# Patient Record
Sex: Female | Born: 1980 | Hispanic: Yes | Marital: Married | Smoking: Never smoker
Health system: Southern US, Community
[De-identification: ages and names within clinical notes are randomized; demographics above are authoritative.]

## PROBLEM LIST (undated history)

## (undated) DIAGNOSIS — R519 Headache, unspecified: Secondary | ICD-10-CM

## (undated) DIAGNOSIS — N83209 Unspecified ovarian cyst, unspecified side: Secondary | ICD-10-CM

## (undated) DIAGNOSIS — D352 Benign neoplasm of pituitary gland: Secondary | ICD-10-CM

## (undated) DIAGNOSIS — E669 Obesity, unspecified: Secondary | ICD-10-CM

## (undated) HISTORY — PX: LAPAROSCOPIC GASTRIC SLEEVE RESECTION: SHX5895

## (undated) HISTORY — DX: Unspecified ovarian cyst, unspecified side: N83.209

## (undated) HISTORY — DX: Obesity, unspecified: E66.9

## (undated) HISTORY — DX: Headache, unspecified: R51.9

## (undated) HISTORY — DX: Benign neoplasm of pituitary gland: D35.2

## (undated) HISTORY — PX: OVARIAN CYST REMOVAL: SHX89

---

## 2020-02-13 LAB — OB RESULTS CONSOLE HEPATITIS B SURFACE ANTIGEN: Hepatitis B Surface Ag: NEGATIVE

## 2020-02-13 LAB — OB RESULTS CONSOLE RUBELLA ANTIBODY, IGM: Rubella: IMMUNE

## 2020-02-13 LAB — OB RESULTS CONSOLE RPR: RPR: NONREACTIVE

## 2020-02-13 LAB — OB RESULTS CONSOLE ABO/RH: RH Type: POSITIVE

## 2020-02-13 LAB — OB RESULTS CONSOLE ANTIBODY SCREEN: Antibody Screen: NEGATIVE

## 2020-02-13 LAB — OB RESULTS CONSOLE HIV ANTIBODY (ROUTINE TESTING): HIV: NONREACTIVE

## 2020-02-15 LAB — OB RESULTS CONSOLE GC/CHLAMYDIA
Chlamydia: NEGATIVE
Gonorrhea: NEGATIVE

## 2020-04-09 NOTE — L&D Delivery Note (Signed)
Operative Delivery Note Notified by Beatrix Fetters, CNM that with the start of pushing there were deep variables, occas late and fetal tachycardia.  Anderson Malta assessed pt and stated the station is good for a vacuum.  Pt is afebile (temp 99) and when not pushing the tracing is overall reassuring but cat 2.  I presented to assess the pt and with pushing the head is at +2.  Verbal consent obtained.  At 5:25 AM a viable female was delivered via Vaginal, Vacuum Neurosurgeon).  Presentation: vertex; Position: Right,, Occiput,, Anterior; Station: +2.  Delivery occurred with 1st full push with vacuum placed at 50cm Hg.  Baby had a vigorous cry on mom's chest and delayed cord clamping was done.  Verbal consent: obtained from patient.  Risks and benefits discussed in detail.  Risks include, but are not limited to the risks of anesthesia, bleeding, infection, damage to maternal tissues, fetal cephalhematoma.  There is also the risk of inability to effect vaginal delivery of the head, or shoulder dystocia that cannot be resolved by established maneuvers, leading to the need for emergency cesarean section.  Rectal performed after repair and good sphincter tone.  APGAR: 8, 9; weight  .   Placenta status: spont,intact .   Cord:  3V cord with the following complications: None.  Cord pH: n/a  Anesthesia:  epidural Instruments: mityvac bell  Episiotomy: None Lacerations:  2nd degree vaginal  Suture Repair: 2.0 3.0 vicryl Est. Blood Loss (mL):  300cc  Mom to postpartum.  Baby to Couplet care / Skin to Skin.  Delice Lesch 08/27/2020, 5:55 AM

## 2020-04-22 ENCOUNTER — Other Ambulatory Visit: Payer: Self-pay | Admitting: Obstetrics and Gynecology

## 2020-04-22 DIAGNOSIS — Z3A22 22 weeks gestation of pregnancy: Secondary | ICD-10-CM

## 2020-04-22 DIAGNOSIS — O09522 Supervision of elderly multigravida, second trimester: Secondary | ICD-10-CM

## 2020-04-22 DIAGNOSIS — Z363 Encounter for antenatal screening for malformations: Secondary | ICD-10-CM

## 2020-05-05 ENCOUNTER — Encounter: Payer: Self-pay | Admitting: *Deleted

## 2020-05-09 ENCOUNTER — Other Ambulatory Visit: Payer: Self-pay | Admitting: *Deleted

## 2020-05-09 ENCOUNTER — Ambulatory Visit: Payer: BC Managed Care – PPO | Admitting: *Deleted

## 2020-05-09 ENCOUNTER — Ambulatory Visit: Payer: BC Managed Care – PPO | Attending: Obstetrics and Gynecology

## 2020-05-09 ENCOUNTER — Encounter: Payer: Self-pay | Admitting: *Deleted

## 2020-05-09 ENCOUNTER — Other Ambulatory Visit: Payer: Self-pay

## 2020-05-09 VITALS — BP 110/54 | HR 82 | Ht 65.0 in

## 2020-05-09 DIAGNOSIS — Z363 Encounter for antenatal screening for malformations: Secondary | ICD-10-CM | POA: Diagnosis present

## 2020-05-09 DIAGNOSIS — Z362 Encounter for other antenatal screening follow-up: Secondary | ICD-10-CM

## 2020-05-09 DIAGNOSIS — O09512 Supervision of elderly primigravida, second trimester: Secondary | ICD-10-CM | POA: Diagnosis present

## 2020-05-09 DIAGNOSIS — O09522 Supervision of elderly multigravida, second trimester: Secondary | ICD-10-CM | POA: Insufficient documentation

## 2020-05-09 DIAGNOSIS — Z3A22 22 weeks gestation of pregnancy: Secondary | ICD-10-CM | POA: Diagnosis present

## 2020-06-06 ENCOUNTER — Ambulatory Visit: Payer: BC Managed Care – PPO | Attending: Obstetrics and Gynecology

## 2020-06-06 ENCOUNTER — Other Ambulatory Visit: Payer: Self-pay | Admitting: *Deleted

## 2020-06-06 ENCOUNTER — Encounter: Payer: Self-pay | Admitting: *Deleted

## 2020-06-06 ENCOUNTER — Other Ambulatory Visit: Payer: Self-pay

## 2020-06-06 ENCOUNTER — Ambulatory Visit: Payer: BC Managed Care – PPO | Admitting: *Deleted

## 2020-06-06 DIAGNOSIS — Z9884 Bariatric surgery status: Secondary | ICD-10-CM

## 2020-06-06 DIAGNOSIS — Z362 Encounter for other antenatal screening follow-up: Secondary | ICD-10-CM | POA: Diagnosis not present

## 2020-07-11 ENCOUNTER — Other Ambulatory Visit: Payer: Self-pay | Admitting: *Deleted

## 2020-07-11 ENCOUNTER — Other Ambulatory Visit: Payer: Self-pay

## 2020-07-11 ENCOUNTER — Ambulatory Visit: Payer: BC Managed Care – PPO | Attending: Obstetrics and Gynecology

## 2020-07-11 ENCOUNTER — Ambulatory Visit: Payer: BC Managed Care – PPO | Admitting: *Deleted

## 2020-07-11 ENCOUNTER — Encounter: Payer: Self-pay | Admitting: *Deleted

## 2020-07-11 VITALS — BP 113/57 | HR 76

## 2020-07-11 DIAGNOSIS — E669 Obesity, unspecified: Secondary | ICD-10-CM

## 2020-07-11 DIAGNOSIS — R638 Other symptoms and signs concerning food and fluid intake: Secondary | ICD-10-CM

## 2020-07-11 DIAGNOSIS — O99843 Bariatric surgery status complicating pregnancy, third trimester: Secondary | ICD-10-CM

## 2020-07-11 DIAGNOSIS — O09513 Supervision of elderly primigravida, third trimester: Secondary | ICD-10-CM | POA: Diagnosis present

## 2020-07-11 DIAGNOSIS — O99213 Obesity complicating pregnancy, third trimester: Secondary | ICD-10-CM | POA: Diagnosis not present

## 2020-07-11 DIAGNOSIS — Z9884 Bariatric surgery status: Secondary | ICD-10-CM | POA: Diagnosis present

## 2020-07-11 DIAGNOSIS — Z363 Encounter for antenatal screening for malformations: Secondary | ICD-10-CM | POA: Diagnosis not present

## 2020-07-11 DIAGNOSIS — Z3A32 32 weeks gestation of pregnancy: Secondary | ICD-10-CM

## 2020-08-09 LAB — OB RESULTS CONSOLE GBS: GBS: NEGATIVE

## 2020-08-15 ENCOUNTER — Encounter: Payer: Self-pay | Admitting: *Deleted

## 2020-08-15 ENCOUNTER — Ambulatory Visit: Payer: Medicaid Other | Admitting: *Deleted

## 2020-08-15 ENCOUNTER — Ambulatory Visit: Payer: Medicaid Other | Attending: Obstetrics

## 2020-08-15 ENCOUNTER — Other Ambulatory Visit: Payer: Self-pay

## 2020-08-15 VITALS — BP 125/67 | HR 65

## 2020-08-15 DIAGNOSIS — Z363 Encounter for antenatal screening for malformations: Secondary | ICD-10-CM | POA: Diagnosis not present

## 2020-08-15 DIAGNOSIS — Z3A37 37 weeks gestation of pregnancy: Secondary | ICD-10-CM

## 2020-08-15 DIAGNOSIS — O09513 Supervision of elderly primigravida, third trimester: Secondary | ICD-10-CM | POA: Insufficient documentation

## 2020-08-15 DIAGNOSIS — R638 Other symptoms and signs concerning food and fluid intake: Secondary | ICD-10-CM

## 2020-08-15 DIAGNOSIS — O99213 Obesity complicating pregnancy, third trimester: Secondary | ICD-10-CM

## 2020-08-18 ENCOUNTER — Encounter (HOSPITAL_COMMUNITY): Payer: Self-pay | Admitting: *Deleted

## 2020-08-18 ENCOUNTER — Telehealth (HOSPITAL_COMMUNITY): Payer: Self-pay | Admitting: *Deleted

## 2020-08-18 NOTE — Telephone Encounter (Signed)
Preadmission screen  

## 2020-08-25 NOTE — H&P (Incomplete)
HPI: 40 y.o. G1P0 @ [redacted]w[redacted]d estimated gestational age (as dated by LMP c/w *** week ultrasound) presents for ***.  Leakage of fluid:  *** Vaginal bleeding:  *** Contractions:  *** Fetal movement:  ***  Prenatal care has been provided by ***  ROS:  Denies fevers, chills, chest pain, visual changes, SOB, RUQ/epigastric pain, N/V, dysuria, hematuria, or sudden onset/worsening bilateral LE or facial edema.  Pregnancy complicated by: ***   Prenatal Transfer Tool  Maternal Diabetes: {Maternal Diabetes:3043596} Genetic Screening: {Genetic Screening:20205} Maternal Ultrasounds/Referrals: {Maternal Ultrasounds / Referrals:20211} Fetal Ultrasounds or other Referrals:  {Fetal Ultrasounds or Other Referrals:20213} Maternal Substance Abuse:  {Maternal Substance Abuse:20223} Significant Maternal Medications:  {Significant Maternal Meds:20233} Significant Maternal Lab Results: {Significant Maternal Lab Results:20235}   Prenatal Labs Blood type:  *** Antibody screen:  Negative CBC:  H/H *** Rubella: Immune RPR:  Non-reactive Hep B:  Negative Hep C:  Negative HIV:  Negative GC/CT:  Negative Glucola:  ***  Immunizations: Tdap: *** Flu: ***  OBHx:  OB History    Gravida  1   Para      Term      Preterm      AB      Living        SAB      IAB      Ectopic      Multiple      Live Births             PMHx:  *** Meds:  PNV, *** Allergy:  No Known Allergies SurgHx: *** SocHx:   *** Tobacco, ETOH, illicit drugs  O: LMP 91/63/8466  Gen. AAOx3, NAD CV.  RRR  Resp. CTAB, no wheezes/rales/rhonchi Abd. Gravid, soft, non-tender throughout, no rebound/guarding Extr.  *** bilateral LE edema, no calf tenderness bilaterally SVE: ***/***/***  SSE: No vulvar/vaginal/cervical lesions.  No blood visualized in vaginal vault. Cervix appeared closed. ***Pooling, *** Ferning, *** Nitrazine.  Last [place]:     FHT: *** baseline, *** variability, *** accels,  *** decels Toco: q  *** min   Labs: see orders  A/P:  40 y.o. G1P0 @ [redacted]w[redacted]d who presents for ***.  - Admit to *** - Admit labs (CBC, T&S, COVID screen) - CEFM/Toco - FWB:  *** - Diet:  *** - IVF:  *** - VTE Prophylaxis:  SCDs - GBS Status:  *** - Presentation:  *** - Pain control:  *** - Induction method:  Drema Dallas, DO [phone] (office)

## 2020-08-26 ENCOUNTER — Encounter (HOSPITAL_COMMUNITY): Payer: Self-pay | Admitting: Obstetrics and Gynecology

## 2020-08-26 ENCOUNTER — Inpatient Hospital Stay (HOSPITAL_COMMUNITY)
Admission: AD | Admit: 2020-08-26 | Discharge: 2020-08-29 | DRG: 807 | Disposition: A | Discharge status: Home / Self Care | Payer: BC Managed Care – PPO | Attending: Obstetrics and Gynecology | Admitting: Obstetrics and Gynecology

## 2020-08-26 ENCOUNTER — Inpatient Hospital Stay (HOSPITAL_COMMUNITY): Payer: BC Managed Care – PPO | Admitting: Anesthesiology

## 2020-08-26 DIAGNOSIS — Z20822 Contact with and (suspected) exposure to covid-19: Secondary | ICD-10-CM | POA: Diagnosis present

## 2020-08-26 DIAGNOSIS — O134 Gestational [pregnancy-induced] hypertension without significant proteinuria, complicating childbirth: Principal | ICD-10-CM | POA: Diagnosis present

## 2020-08-26 DIAGNOSIS — O329XX0 Maternal care for malpresentation of fetus, unspecified, not applicable or unspecified: Secondary | ICD-10-CM | POA: Diagnosis not present

## 2020-08-26 DIAGNOSIS — O99844 Bariatric surgery status complicating childbirth: Secondary | ICD-10-CM | POA: Diagnosis present

## 2020-08-26 DIAGNOSIS — O26893 Other specified pregnancy related conditions, third trimester: Secondary | ICD-10-CM | POA: Diagnosis present

## 2020-08-26 DIAGNOSIS — Z3A38 38 weeks gestation of pregnancy: Secondary | ICD-10-CM

## 2020-08-26 DIAGNOSIS — Z3A00 Weeks of gestation of pregnancy not specified: Secondary | ICD-10-CM | POA: Diagnosis not present

## 2020-08-26 DIAGNOSIS — D352 Benign neoplasm of pituitary gland: Secondary | ICD-10-CM | POA: Diagnosis present

## 2020-08-26 DIAGNOSIS — Z8616 Personal history of COVID-19: Secondary | ICD-10-CM

## 2020-08-26 DIAGNOSIS — O99214 Obesity complicating childbirth: Secondary | ICD-10-CM | POA: Diagnosis present

## 2020-08-26 DIAGNOSIS — O99892 Other specified diseases and conditions complicating childbirth: Secondary | ICD-10-CM | POA: Diagnosis present

## 2020-08-26 LAB — CBC
HCT: 38.1 % (ref 36.0–46.0)
Hemoglobin: 12.9 g/dL (ref 12.0–15.0)
MCH: 29.9 pg (ref 26.0–34.0)
MCHC: 33.9 g/dL (ref 30.0–36.0)
MCV: 88.4 fL (ref 80.0–100.0)
Platelets: 271 10*3/uL (ref 150–400)
RBC: 4.31 MIL/uL (ref 3.87–5.11)
RDW: 14.6 % (ref 11.5–15.5)
WBC: 9.3 10*3/uL (ref 4.0–10.5)
nRBC: 0 % (ref 0.0–0.2)

## 2020-08-26 LAB — COMPREHENSIVE METABOLIC PANEL
ALT: 13 U/L (ref 0–44)
AST: 19 U/L (ref 15–41)
Albumin: 2.8 g/dL — ABNORMAL LOW (ref 3.5–5.0)
Alkaline Phosphatase: 118 U/L (ref 38–126)
Anion gap: 8 (ref 5–15)
BUN: 5 mg/dL — ABNORMAL LOW (ref 6–20)
CO2: 19 mmol/L — ABNORMAL LOW (ref 22–32)
Calcium: 9 mg/dL (ref 8.9–10.3)
Chloride: 106 mmol/L (ref 98–111)
Creatinine, Ser: 0.65 mg/dL (ref 0.44–1.00)
GFR, Estimated: >60 mL/min (ref >60)
Glucose, Bld: 106 mg/dL — ABNORMAL HIGH (ref 70–99)
Potassium: 3.9 mmol/L (ref 3.5–5.1)
Sodium: 133 mmol/L — ABNORMAL LOW (ref 135–145)
Total Bilirubin: 0.5 mg/dL (ref 0.3–1.2)
Total Protein: 6.3 g/dL — ABNORMAL LOW (ref 6.5–8.1)

## 2020-08-26 LAB — TYPE AND SCREEN
ABO/RH(D): O POS
Antibody Screen: NEGATIVE

## 2020-08-26 LAB — PROTEIN / CREATININE RATIO, URINE
Creatinine, Urine: 52.56 mg/dL
Total Protein, Urine: <6 mg/dL

## 2020-08-26 LAB — RAPID HIV SCREEN (HIV 1/2 AB+AG)
HIV 1/2 Antibodies: NONREACTIVE
HIV-1 P24 Antigen - HIV24: NONREACTIVE

## 2020-08-26 LAB — RESP PANEL BY RT-PCR (FLU A&B, COVID) ARPGX2
Influenza A by PCR: NEGATIVE
Influenza B by PCR: NEGATIVE
SARS Coronavirus 2 by RT PCR: NEGATIVE

## 2020-08-26 LAB — POCT FERN TEST: POCT Fern Test: POSITIVE

## 2020-08-26 LAB — RPR: RPR Ser Ql: NONREACTIVE

## 2020-08-26 MED ORDER — LACTATED RINGERS IV SOLN
500.0000 mL | Freq: Once | INTRAVENOUS | Status: AC
  Administered 2020-08-26: 500 mL via INTRAVENOUS

## 2020-08-26 MED ORDER — ACETAMINOPHEN 325 MG PO TABS: 650.0000 mg | ORAL_TABLET | ORAL | Status: DC | PRN

## 2020-08-26 MED ORDER — LIDOCAINE HCL (PF) 1 % IJ SOLN
INTRAMUSCULAR | Status: DC | PRN
  Administered 2020-08-26: 2 mL via EPIDURAL
  Administered 2020-08-26: 10 mL via EPIDURAL

## 2020-08-26 MED ORDER — EPHEDRINE 5 MG/ML INJ: 10.0000 mg | INTRAVENOUS | Status: DC | PRN

## 2020-08-26 MED ORDER — OXYTOCIN-SODIUM CHLORIDE 30-0.9 UT/500ML-% IV SOLN
2.5000 [IU]/h | INTRAVENOUS | Status: DC
  Filled 2020-08-26 (×2): qty 500

## 2020-08-26 MED ORDER — TERBUTALINE SULFATE 1 MG/ML IJ SOLN: 0.2500 mg | Freq: Once | INTRAMUSCULAR | Status: DC | PRN

## 2020-08-26 MED ORDER — FENTANYL-BUPIVACAINE-NACL 0.5-0.125-0.9 MG/250ML-% EP SOLN
EPIDURAL | Status: DC | PRN
  Administered 2020-08-26: 12 mL/h via EPIDURAL

## 2020-08-26 MED ORDER — LACTATED RINGERS IV SOLN
500.0000 mL | INTRAVENOUS | Status: DC | PRN
Start: 2020-08-26 — End: 2020-08-27
  Administered 2020-08-26: 500 mL via INTRAVENOUS

## 2020-08-26 MED ORDER — OXYTOCIN-SODIUM CHLORIDE 30-0.9 UT/500ML-% IV SOLN
1.0000 m[IU]/min | INTRAVENOUS | Status: DC
  Administered 2020-08-26: 4 m[IU]/min via INTRAVENOUS
  Administered 2020-08-26: 2 m[IU]/min via INTRAVENOUS
  Administered 2020-08-26: 6 m[IU]/min via INTRAVENOUS
  Administered 2020-08-26: 8 m[IU]/min via INTRAVENOUS
  Administered 2020-08-26: 10 m[IU]/min via INTRAVENOUS
  Administered 2020-08-26: 6 m[IU]/min via INTRAVENOUS

## 2020-08-26 MED ORDER — LIDOCAINE HCL (PF) 1 % IJ SOLN: 30.0000 mL | INTRAMUSCULAR | Status: DC | PRN

## 2020-08-26 MED ORDER — LACTATED RINGERS IV SOLN: INTRAVENOUS | Status: DC

## 2020-08-26 MED ORDER — OXYTOCIN BOLUS FROM INFUSION
333.0000 mL | Freq: Once | INTRAVENOUS | Status: AC
  Administered 2020-08-27: 333 mL via INTRAVENOUS

## 2020-08-26 MED ORDER — OXYCODONE-ACETAMINOPHEN 5-325 MG PO TABS: 1.0000 | ORAL_TABLET | ORAL | Status: DC | PRN

## 2020-08-26 MED ORDER — PHENYLEPHRINE 40 MCG/ML (10ML) SYRINGE FOR IV PUSH (FOR BLOOD PRESSURE SUPPORT): 80.0000 ug | PREFILLED_SYRINGE | INTRAVENOUS | Status: DC | PRN

## 2020-08-26 MED ORDER — ONDANSETRON HCL 4 MG/2ML IJ SOLN: 4.0000 mg | Freq: Four times a day (QID) | INTRAMUSCULAR | Status: DC | PRN

## 2020-08-26 MED ORDER — SOD CITRATE-CITRIC ACID 500-334 MG/5ML PO SOLN
30.0000 mL | ORAL | Status: DC | PRN
  Filled 2020-08-26: qty 15

## 2020-08-26 MED ORDER — FENTANYL CITRATE (PF) 100 MCG/2ML IJ SOLN
50.0000 ug | INTRAMUSCULAR | Status: DC | PRN
  Administered 2020-08-26: 100 ug via INTRAVENOUS
  Filled 2020-08-26: qty 2

## 2020-08-26 MED ORDER — OXYCODONE-ACETAMINOPHEN 5-325 MG PO TABS: 2.0000 | ORAL_TABLET | ORAL | Status: DC | PRN

## 2020-08-26 MED ORDER — DIPHENHYDRAMINE HCL 50 MG/ML IJ SOLN: 12.5000 mg | INTRAMUSCULAR | Status: DC | PRN

## 2020-08-26 MED ORDER — FENTANYL-BUPIVACAINE-NACL 0.5-0.125-0.9 MG/250ML-% EP SOLN
12.0000 mL/h | EPIDURAL | Status: DC | PRN
  Administered 2020-08-26: 12 mL/h via EPIDURAL
  Filled 2020-08-26 (×2): qty 250

## 2020-08-26 MED ORDER — OXYTOCIN-SODIUM CHLORIDE 30-0.9 UT/500ML-% IV SOLN: 1.0000 m[IU]/min | INTRAVENOUS | Status: DC

## 2020-08-26 NOTE — H&P (Signed)
OB ADMISSION/ HISTORY & PHYSICAL:  Admission Date: 08/26/2020  1:42 AM  Admit Diagnosis: Normal Labor  Elizabeth Galvan is a 40 y.o. female G75P0 [redacted]w[redacted]d presenting for ctx and LOF. Endorses active FM, denies vaginal bleeding. Ctx began @ 0100 History of current pregnancy: G1P0   Primary OB Provider: Sadie Haber Patient entered care with [facility] at 8.6 wks.   EDC 08/31/20 by 8.6 wk U/S  Anatomy scan:  20.1 wks, complete w/ anterior/ fundal placenta.   Antenatal testing: for BMI > 40 started at 32 weeks Last evaluation: 38.6  wks  Significant prenatal events:  Hx of gastric sleeve Migraines Obesity - BMI 3 AMA Pituitary Adenoma - f/u PP   Prenatal Labs: ABO, Rh: O/Positive/-- (11/06 0000) Antibody: Negative (11/06 0000) Rubella: Immune (11/06 0000)  RPR: Nonreactive (11/06 0000)  HBsAg: Negative (11/06 0000)  HIV: Non-reactive (11/06 0000)  GTT: HgbA1C 5.5 GBS:   Negative GC/CHL: negative/negative Genetics: Low risk female Tdap/influenza vaccines: UTD/UTD   OB History  Gravida Para Term Preterm AB Living  1            SAB IAB Ectopic Multiple Live Births               # Outcome Date GA Lbr Len/2nd Weight Sex Delivery Anes PTL Lv  1 Current             Medical / Surgical History: Past medical history:  Past Medical History:  Diagnosis Date  . Headache    migraines  . Obesity   . Ovarian cyst   . Pituitary adenoma ([place])     Past surgical history:  Past Surgical History:  Procedure Laterality Date  . LAPAROSCOPIC GASTRIC SLEEVE RESECTION    . OVARIAN CYST REMOVAL     Family History:  Family History  Problem Relation Age of Onset  . Diabetes Mother   . Hypertension Mother   . Varicose Veins Mother     Social History:  reports that she has never smoked. She has never used smokeless tobacco. She reports that she does not drink alcohol and does not use drugs.  Allergies: Patient has no known allergies.   Current Medications at time of admission:  Prior to  Admission medications   Medication Sig Start Date End Date Taking? Authorizing Provider  aspirin EC 81 MG tablet Take 81 mg by mouth daily. Swallow whole.   Yes [provider]  ferrous sulfate 324 MG TBEC Take 324 mg by mouth.   Yes [provider]  Prenatal Vit-Fe Fumarate-FA (PRENATAL MULTIVITAMIN) TABS tablet Take 1 tablet by mouth daily at 12 noon.   Yes [provider]  calcium-vitamin D (OSCAL WITH D) 500-200 MG-UNIT tablet Take 1 tablet by mouth.    [provider]    Review of Systems: Constitutional: Negative   HENT: Negative   Eyes: Negative   Respiratory: Negative   Cardiovascular: Negative   Gastrointestinal: Negative  Genitourinary: negative for bloody show, positive for LOF   Musculoskeletal: Negative   Skin: Negative   Neurological: Negative   Endo/Heme/Allergies: Negative   Psychiatric/Behavioral: Negative    Physical Exam: VS: Blood pressure 138/73, pulse 68, temperature 98.5 F (36.9 C), resp. rate 18, height 5\' 5"  (1.651 m), weight 126.1 kg, last menstrual period 11/30/2019. AAO x3, no signs of distress Cardiovascular: RRR Respiratory: Lung fields clear to ausculation GU/GI: Abdomen gravid, non-tender, non-distended, active FM, vertex Extremities: negative edema, negative for pain, tenderness, and cords  Cervical exam: 1/50/-3 FHR: baseline rate  145 / variability moderate / accelerations 10x10 / absent decelerations TOCO: irregular   Prenatal Transfer Tool  Maternal Diabetes: No Genetic Screening: Normal Maternal Ultrasounds/Referrals: Normal Fetal Ultrasounds or other Referrals:  None Maternal Substance Abuse:  No Significant Maternal Medications:  None Significant Maternal Lab Results: Group B Strep negative    Assessment: 40 y.o. G1P0 [redacted]w[redacted]d admitted for normal labor. Reports SROM on 08/26/20 @ 0100. Clear fluid.  Latent stage of labor FHR category 1 GBS Negative Pain management plan: Epidural  PRN   Plan:  Admit to L&D Routine admission orders Epidural PRN Report given to Dr Delora Fuel at [facility] MSN, CNM 08/26/2020 3:31 AM

## 2020-08-26 NOTE — Progress Notes (Signed)
OB Progress Note  S: Patient resting comfortably. Feeling some contractions.  O: BP (!) 116/59   Pulse (!) 58   Temp 98.3 F (36.8 C) (Oral)   Resp 17   Ht 5\' 5"  (1.651 m)   Wt 126.1 kg   LMP 11/30/2019   SpO2 100%   BMI 46.26 kg/m   FHT: 145bpm, minimal to moderate variablity, - accels, - decels, scalp stim present Toco: q3 minutes SVE: 2-3/50/high, meconium noted at time of cervical exam  A/P: 40 y.o. G1P0 @ [redacted]w[redacted]d admitted for SROM/labor.  FWB: Cat. I to Category II, reassurance by scalp stim 15x15 Labor course: Will start Pitocin 2x2 to maximum of 55mU/min Pain: Per patient request, desires epidural GBS: Negative Anticipate SVD  Drema Dallas, DO [phone] (office)

## 2020-08-26 NOTE — Progress Notes (Addendum)
Terita is G1P0 at 38.4 weeks SROM at 0100, light mec Morbid obesity Hx of Gastric Sleeve 2019 Pre GDM, no meds Non-active pituitary adenoma Pitocin infusing 11mU MVU 270 Epidural in place GHTN, no meds required. Labs WNL. PCR too low to calculate  S: Patient resting comfortably with epidural, feeling more pressure  O: BP 130/69 (BP Location: Right Arm)   Pulse (!) 56   Temp 97.9 F (36.6 C) (Oral)   Resp 16   Ht 5\' 5"  (1.651 m)   Wt 126.1 kg   LMP 11/30/2019   SpO2 100%   BMI 46.26 kg/m   FHT: 145 bpm, moderate variablity, 10x10s accels, occasional variable deceleration Toco: q2-3 minutes SVE: 9/90/-1 per RN IUPC and FSE in place  A/P: 40 y.o. G1P0 @ [redacted]w[redacted]d admitted for SROM/labor.  FWB: IUPC and FSE in place, Cat II tracing Labor course: Pitocoin at 4mU/min, discussion with Dr. Mancel Bale to decrease to 12 mU Pain: Comfortable with epidural, feeling pressure GBS: Negative Anticipate SVD RN informed to contact NICU for delivery due to meconium

## 2020-08-26 NOTE — Progress Notes (Signed)
OB Progress Note  S: Patient resting comfortably with epidural.  O: BP 120/61   Pulse (!) 56   Temp 98.4 F (36.9 C) (Oral)   Resp 20   Ht 5\' 5"  (1.651 m)   Wt 126.1 kg   LMP 11/30/2019   SpO2 100%   BMI 46.26 kg/m   FHT: 140bpm, minimal to moderate variablity, 10x10s accels, no decels Toco: q2-3 minutes, currently not graphing well SVE: 4/80/-3, sutures palpated  A/P: 40 y.o. G1P0 @ [redacted]w[redacted]d admitted for SROM/labor.  FWB: Category II - variability minimal but improved with scalp stimulation and patient laying on side Labor course: Pitocoin at 68mU/min Pain: Comfortable with epidural GBS: Negative Anticipate SVD  Drema Dallas, DO [phone] (office)

## 2020-08-26 NOTE — Progress Notes (Signed)
Tracing reviewed remotely after call from CNM.  I recommend bolus of fluid.  BPs are significantly lower than her normal BPs and may be exacerbating pts variability which is currently minimal for about 72min (may also be a sleep cycle).  At sign out we were notified of one elevated BP in her prenatal care.  She has had some elevated BPs since admission as well with one in the severe range.  Will send urine PCR and labs but pt currently meets criteria for GHTN.  Will cont to monitor closely.

## 2020-08-26 NOTE — Progress Notes (Addendum)
Chanette is G1P0 at 38.4 weeks SROM at 0100, light mec Morbid obesity Hx of Gastric Sleeve 2019 Pre GDM, no meds Non-active pituitary adenoma Pitocin infusing 62mU Epidural in place  S: Patient resting comfortably with epidural.  O: BP 95/70   Pulse (!) 56   Temp 98.4 F (36.9 C) (Axillary)   Resp 16   Ht 5\' 5"  (1.651 m)   Wt 126.1 kg   LMP 11/30/2019   SpO2 100%   BMI 46.26 kg/m   FHT: 150bpm, minimal to moderate variablity, 10x10s accels, no decels Toco: q2-3 minutes, currently not graphing well SVE: 4/80/-3, confirmed via bedside US. No cervical change IUPC and FSE placed without difficulty  A/P: 40 y.o. G1P0 @ [redacted]w[redacted]d admitted for SROM/labor.  FWB: IUPC and FSE placed Cat II tracing, variability minimal on occasion, however responds to scalp stimulation and patient movement.  Labor course: Pitocoin at 75mU/min Pain: Comfortable with epidural GBS: Negative Anticipate SVD Discussed variability with Dr. Mancel Bale now that internals are in place, IVF bolus ordered

## 2020-08-26 NOTE — Anesthesia Preprocedure Evaluation (Signed)
Anesthesia Evaluation  Patient identified by MRN, date of birth, ID band Patient awake    Reviewed: Allergy & Precautions, Patient's Chart, lab work & pertinent test results  Airway Mallampati: III  TM Distance: >3 FB Neck ROM: Full    Dental no notable dental hx.    Pulmonary neg pulmonary ROS,    Pulmonary exam normal breath sounds clear to auscultation       Cardiovascular negative cardio ROS Normal cardiovascular exam Rhythm:Regular Rate:Normal     Neuro/Psych  Headaches, negative psych ROS   GI/Hepatic Neg liver ROS, S/p gastric sleeve   Endo/Other  Morbid obesityBMI 46  Renal/GU negative Renal ROS  negative genitourinary   Musculoskeletal negative musculoskeletal ROS (+)   Abdominal (+) + obese,   Peds negative pediatric ROS (+)  Hematology negative hematology ROS (+) hct 38.1, plt 271   Anesthesia Other Findings   Reproductive/Obstetrics (+) Pregnancy                             Anesthesia Physical Anesthesia Plan  ASA: III and emergent  Anesthesia Plan: Epidural   Post-op Pain Management:    Induction:   PONV Risk Score and Plan: 2  Airway Management Planned: Natural Airway  Additional Equipment: None  Intra-op Plan:   Post-operative Plan:   Informed Consent: I have reviewed the patients History and Physical, chart, labs and discussed the procedure including the risks, benefits and alternatives for the proposed anesthesia with the patient or authorized representative who has indicated his/her understanding and acceptance.       Plan Discussed with:   Anesthesia Plan Comments:         Anesthesia Quick Evaluation

## 2020-08-26 NOTE — Progress Notes (Signed)

## 2020-08-26 NOTE — MAU Note (Signed)
Pt reports she had a gush of fluid around 1am and again when she got in the car. Good fetal movement . Having ctx off and on getting stronger.

## 2020-08-26 NOTE — Anesthesia Procedure Notes (Signed)
Epidural Patient location during procedure: OB Start time: 08/26/2020 9:42 AM End time: 08/26/2020 9:52 AM  Staffing Anesthesiologist: Pervis Hocking, DO Performed: anesthesiologist   Preanesthetic Checklist Completed: patient identified, IV checked, risks and benefits discussed, monitors and equipment checked, pre-op evaluation and timeout performed  Epidural Patient position: sitting Prep: DuraPrep and site prepped and draped Patient monitoring: continuous pulse ox, blood pressure, heart rate and cardiac monitor Approach: midline Location: L3-L4 Injection technique: LOR air  Needle:  Needle type: Tuohy  Needle gauge: 17 G Needle length: 9 cm Needle insertion depth: 7 cm Catheter type: closed end flexible Catheter size: 19 Gauge Catheter at skin depth: 12 cm Test dose: negative  Assessment Sensory level: T8 Events: blood not aspirated, injection not painful, no injection resistance, no paresthesia and negative IV test  Additional Notes Patient identified. Risks/Benefits/Options discussed with patient including but not limited to bleeding, infection, nerve damage, paralysis, failed block, incomplete pain control, headache, blood pressure changes, nausea, vomiting, reactions to medication both or allergic, itching and postpartum back pain. Confirmed with bedside nurse the patient's most recent platelet count. Confirmed with patient that they are not currently taking any anticoagulation, have any bleeding history or any family history of bleeding disorders. Patient expressed understanding and wished to proceed. All questions were answered. Sterile technique was used throughout the entire procedure. Please see nursing notes for vital signs. Test dose was given through epidural catheter and negative prior to continuing to dose epidural or start infusion. Warning signs of high block given to the patient including shortness of breath, tingling/numbness in hands, complete motor  block, or any concerning symptoms with instructions to call for help. Patient was given instructions on fall risk and not to get out of bed. All questions and concerns addressed with instructions to call with any issues or inadequate analgesia.  Reason for block:procedure for pain

## 2020-08-27 ENCOUNTER — Encounter (HOSPITAL_COMMUNITY): Payer: Self-pay | Admitting: Obstetrics and Gynecology

## 2020-08-27 MED ORDER — BENZOCAINE-MENTHOL 20-0.5 % EX AERO
1.0000 "application " | INHALATION_SPRAY | CUTANEOUS | Status: DC | PRN
  Administered 2020-08-27 – 2020-08-29 (×3): 1 via TOPICAL
  Filled 2020-08-27 (×3): qty 56

## 2020-08-27 MED ORDER — ONDANSETRON HCL 4 MG/2ML IJ SOLN: 4.0000 mg | INTRAMUSCULAR | Status: DC | PRN

## 2020-08-27 MED ORDER — ACETAMINOPHEN 325 MG PO TABS
650.0000 mg | ORAL_TABLET | ORAL | Status: DC | PRN
  Administered 2020-08-27 – 2020-08-29 (×9): 650 mg via ORAL
  Filled 2020-08-27 (×9): qty 2

## 2020-08-27 MED ORDER — COCONUT OIL OIL: 1.0000 "application " | TOPICAL_OIL | Status: DC | PRN

## 2020-08-27 MED ORDER — ONDANSETRON HCL 4 MG PO TABS: 4.0000 mg | ORAL_TABLET | ORAL | Status: DC | PRN

## 2020-08-27 MED ORDER — IBUPROFEN 600 MG PO TABS
600.0000 mg | ORAL_TABLET | Freq: Four times a day (QID) | ORAL | Status: DC
  Administered 2020-08-27 – 2020-08-29 (×9): 600 mg via ORAL
  Filled 2020-08-27 (×9): qty 1

## 2020-08-27 MED ORDER — PRENATAL MULTIVITAMIN CH
1.0000 | ORAL_TABLET | Freq: Every day | ORAL | Status: DC
  Administered 2020-08-28 – 2020-08-29 (×2): 1 via ORAL
  Filled 2020-08-27 (×2): qty 1

## 2020-08-27 MED ORDER — ZOLPIDEM TARTRATE 5 MG PO TABS
5.0000 mg | ORAL_TABLET | Freq: Every evening | ORAL | Status: DC | PRN
Start: 2020-08-27 — End: 2020-08-29

## 2020-08-27 MED ORDER — SENNOSIDES-DOCUSATE SODIUM 8.6-50 MG PO TABS
2.0000 | ORAL_TABLET | Freq: Every day | ORAL | Status: DC
  Administered 2020-08-28 – 2020-08-29 (×2): 2 via ORAL
  Filled 2020-08-27 (×2): qty 2

## 2020-08-27 MED ORDER — DIBUCAINE (PERIANAL) 1 % EX OINT: 1.0000 "application " | TOPICAL_OINTMENT | CUTANEOUS | Status: DC | PRN

## 2020-08-27 MED ORDER — WITCH HAZEL-GLYCERIN EX PADS
1.0000 "application " | MEDICATED_PAD | CUTANEOUS | Status: DC | PRN
  Administered 2020-08-28: 1 via TOPICAL

## 2020-08-27 MED ORDER — DIPHENHYDRAMINE HCL 25 MG PO CAPS: 25.0000 mg | ORAL_CAPSULE | Freq: Four times a day (QID) | ORAL | Status: DC | PRN

## 2020-08-27 MED ORDER — TETANUS-DIPHTH-ACELL PERTUSSIS 5-2.5-18.5 LF-MCG/0.5 IM SUSY: 0.5000 mL | PREFILLED_SYRINGE | Freq: Once | INTRAMUSCULAR | Status: DC

## 2020-08-27 MED ORDER — SIMETHICONE 80 MG PO CHEW: 80.0000 mg | CHEWABLE_TABLET | ORAL | Status: DC | PRN

## 2020-08-27 NOTE — Progress Notes (Signed)
Elizabeth Galvan is G1P0 at 38.5 weeks SROM at 0100, light mec now greater than 24h Morbid obesity Hx of Gastric Sleeve 2019 Pre GDM, no meds Non-active pituitary adenoma Pitocin infusing 56mU MVU adequate Epidural in place GHTN, no meds required. Labs WNL. PCR too low to calculate  S: Patient resting comfortably with epidural, feeling more pressure  O: BP 134/61   Pulse 63   Temp 99 F (37.2 C) (Oral)   Resp 16   Ht 5\' 5"  (1.651 m)   Wt 126.1 kg   LMP 11/30/2019   SpO2 100%   BMI 46.26 kg/m   FHT: 150 bpm, moderate variablity, absent accels,absent deceleration Toco: q2-3 minutes SVE: 10/100/plus 1 per RN IUPC and FSE in place  A/P: 40 y.o. G1P0 @ [redacted]w[redacted]d admitted for SROM/labor.  FWB: IUPC and FSE in place, Cat I tracing Labor course: Pitocoin at 22mU/min Pain: Comfortable with epidural, feeling pressure GBS: Negative Anticipate SVD RN informed to contact NICU for delivery due to meconium Begin actively pushing

## 2020-08-27 NOTE — Progress Notes (Signed)
Elizabeth Galvan is G1P0 at 38.5 weeks SROM at 0100, light mec now greater than 24h Morbid obesity Hx of Gastric Sleeve 2019 Pre GDM, no meds Non-active pituitary adenoma Pitocin infusing 40mU MVU adequate Epidural in place GHTN, no meds required. Labs WNL. PCR too low to calculate Pushing x 20 mins, deep variables with new onset tachycardia  S: Patient resting comfortably with epidural, feeling more pressure  O: BP (!) 107/59   Pulse 67   Temp 99 F (37.2 C) (Oral)   Resp 16   Ht 5\' 5"  (1.651 m)   Wt 126.1 kg   LMP 11/30/2019   SpO2 100%   BMI 46.26 kg/m   FHT: 170 bpm, moderate variablity, absent accels,repetitive deep deceleration with pushing Toco: q2-3 minutes SVE: 10/100/plus 2. Poor maternal pushing efforts IUPC and FSE in place  A/P: 40 y.o. G1P0 @ [redacted]w[redacted]d admitted for SROM/labor.  FWB: IUPC and FSE in place, Cat 2 tracing  Labor course: Pitocoin at 34mU/min now pushing. Dr Mancel Bale updated on maternal pushing efforts, FHR tracing. Good candidate for VAVD given prolonged ROM, fetal station, and FHR tracing. MD in for evaluation and discussion Pain: Comfortable with epidural GBS: Negative Anticipate VAVD

## 2020-08-27 NOTE — Progress Notes (Signed)
Lamanda is G1P0 at 38.5 weeks SROM at 0100, light mec now greater than 24h Morbid obesity Hx of Gastric Sleeve 2019 Pre GDM, no meds Non-active pituitary adenoma Pitocin infusing 60mU MVU adequate Epidural in place GHTN, no meds required. Labs WNL. PCR too low to calculate  S: Patient resting comfortably with epidural, feeling more pressure  O: BP 134/61   Pulse 63   Temp 99 F (37.2 C) (Oral)   Resp 16   Ht 5\' 5"  (1.651 m)   Wt 126.1 kg   LMP 11/30/2019   SpO2 100%   BMI 46.26 kg/m   FHT: 150 bpm, moderate variablity, absent accels,absent deceleration Toco: q2-3 minutes SVE: 10/100/0 per RN IUPC and FSE in place  A/P: 40 y.o. G1P0 @ [redacted]w[redacted]d admitted for SROM/labor.  FWB: IUPC and FSE in place, Cat I tracing Labor course: Pitocoin at 67mU/min Pain: Comfortable with epidural, feeling pressure GBS: Negative Anticipate SVD RN informed to contact NICU for delivery due to meconium Trial push unsuccessful.Continue position change. Recheck in 1 hour

## 2020-08-27 NOTE — Lactation Note (Signed)
This note was copied from a baby's chart. Lactation Consultation Note  Patient Name: Elizabeth Galvan OTRRN'H Date: 08/27/2020 Reason for consult: Follow-up assessment;Primapara;Early term 37-38.6wks Age:38 hours Breastfeeding on arrival.  Reports painful.  Assist with repositioning and latch. Urged hand express and rub expressed mothers milk on nipples and air dry. Good breastfeeding observed.  Urged to call lactation as needed.    Maternal Data Has patient been taught Hand Expression?: Yes Does the patient have breastfeeding experience prior to this delivery?: No  Feeding Mother's Current Feeding Choice: Breast Milk and Formula  LATCH Score Latch: Grasps breast easily, tongue down, lips flanged, rhythmical sucking.  Audible Swallowing: A few with stimulation  Type of Nipple: Everted at rest and after stimulation  Comfort (Breast/Nipple): Filling, red/small blisters or bruises, mild/mod discomfort (nipples red/compressed/painful per mom)  Hold (Positioning): Assistance needed to correctly position infant at breast and maintain latch.  LATCH Score: 7   Lactation Tools Discussed/Used    Interventions Interventions: Assisted with latch;Breast feeding basics reviewed;Hand express;Adjust position  Discharge    Consult Status Consult Status: Follow-up Date: 08/28/20 Follow-up type: [place] 08/27/2020, 5:15 PM

## 2020-08-27 NOTE — Progress Notes (Addendum)
Elizabeth Galvan is G1P0 at 38.5 weeks SROM at 0100, light mec now greater than 24h Morbid obesity Hx of Gastric Sleeve 2019 Pre GDM, no meds Non-active pituitary adenoma Pitocin infusing 85mU MVU adequate Epidural in place GHTN, no meds required. Labs WNL. PCR too low to calculate  S: Patient resting comfortably with epidural, feeling more pressure  O: BP 134/61   Pulse 63   Temp 99 F (37.2 C) (Oral)   Resp 16   Ht 5\' 5"  (1.651 m)   Wt 126.1 kg   LMP 11/30/2019   SpO2 100%   BMI 46.26 kg/m   FHT: 150 bpm, moderate variablity, 15x15s accels,absent deceleration Toco: q2-3 minutes SVE: rim/90/0 per RN IUPC and FSE in place  A/P: 40 y.o. G1P0 @ [redacted]w[redacted]d admitted for SROM/labor.  FWB: IUPC and FSE in place, Cat I tracing Labor course: Pitocoin at 65mU/min Pain: Comfortable with epidural, feeling pressure GBS: Negative Anticipate SVD RN informed to contact NICU for delivery due to meconium Labor down 1-2 hours

## 2020-08-27 NOTE — Lactation Note (Signed)
This note was copied from a baby's chart. Lactation Consultation Note 0615 Baby less than 1 hr old at time of consult. Mom holding baby STS. Assisted baby to cradle position. Sat mom upright d/t baby falling into breast.  LC got baby latched after several attempts. Baby has increased respirations. Notified RN. Mom has soft breast tissue w/everted nipples. Baby BF well w/assistance. Answered moms questions. Mom will be f/u on MBU.  Patient Name: Elizabeth Galvan JOITG'P Date: 08/27/2020 Reason for consult: L&D Initial assessment;Early term 37-38.6wks;Primapara Age:65 hours  Maternal Data Does the patient have breastfeeding experience prior to this delivery?: No  Feeding    LATCH Score Latch: Repeated attempts needed to sustain latch, nipple held in mouth throughout feeding, stimulation needed to elicit sucking reflex.  Audible Swallowing: None  Type of Nipple: Everted at rest and after stimulation (short shaft)  Comfort (Breast/Nipple): Soft / non-tender  Hold (Positioning): Full assist, staff holds infant at breast  LATCH Score: 5   Lactation Tools Discussed/Used    Interventions Interventions: Breast feeding basics reviewed;Adjust position;Assisted with latch;Support pillows;Skin to skin;Position options;Breast massage;Hand express;Breast compression  Discharge    Consult Status Consult Status: Follow-up Date: 08/27/20 Follow-up type: In-patient    Theodoro Kalata 08/27/2020, 6:38 AM

## 2020-08-28 LAB — CBC
HCT: 32.3 % — ABNORMAL LOW (ref 36.0–46.0)
Hemoglobin: 10.8 g/dL — ABNORMAL LOW (ref 12.0–15.0)
MCH: 29.6 pg (ref 26.0–34.0)
MCHC: 33.4 g/dL (ref 30.0–36.0)
MCV: 88.5 fL (ref 80.0–100.0)
Platelets: 221 10*3/uL (ref 150–400)
RBC: 3.65 MIL/uL — ABNORMAL LOW (ref 3.87–5.11)
RDW: 15.1 % (ref 11.5–15.5)
WBC: 15 10*3/uL — ABNORMAL HIGH (ref 4.0–10.5)
nRBC: 0 % (ref 0.0–0.2)

## 2020-08-28 NOTE — Progress Notes (Signed)
PPD#  1 SVD w/ 2nd degree Information for the patient's newborn:  Orelia, Brandstetter Girl Fredricka [[id number]]  female   S:   Reports feeling "good"  Tolerating PO fluid and solids No nausea or vomiting Bleeding is light Pain controlled with acetaminophen and ibuprofen (OTC) Up ad lib / ambulatory / voiding w/o difficulty Feeding: Breast    O:   VS: BP 129/70 (BP Location: Right Leg)   Pulse 74   Temp 98.1 F (36.7 C) (Oral)   Resp 18   Ht 5\' 5"  (1.651 m)   Wt 126.1 kg   LMP 11/30/2019   SpO2 97%   Breastfeeding Unknown   BMI 46.26 kg/m   LABS:  Recent Labs    08/26/20 0315 08/28/20 0422  WBC 9.3 15.0*  HGB 12.9 10.8*  PLT 271 221   Blood type: --/--/O POS (05/20 0340) Rubella: Immune (11/06 0000)                      I&O: Intake/Output      05/21 0701 05/22 0700 05/22 0701 05/23 0700   Urine (mL/kg/hr) 450 (0.1)    Blood     Total Output 450    Net -450           Physical Exam: Alert and oriented X3 Lungs: Clear and unlabored Heart: regular rate and rhythm / no mumurs Abdomen: soft, non-tender, non-distended  Fundus: firm, non-tender Perineum: well approximated Lochia: minimal Extremities: negative edema, no calf pain or tenderness    A:  PPD # 1 Normal exam  P:  Routine post partum orders Anticipate D/C on 08/29/20   Plan reviewed w/ Dr. Herbie Drape, MSN, CNM 08/28/2020, 10:07 AM

## 2020-08-28 NOTE — Anesthesia Postprocedure Evaluation (Signed)
Anesthesia Post Note  Patient: Elizabeth Galvan  Procedure(s) Performed: AN AD [place]     Patient location during evaluation: Mother Baby Anesthesia Type: Epidural Level of consciousness: awake and alert Pain management: pain level controlled Vital Signs Assessment: post-procedure vital signs reviewed and stable Respiratory status: spontaneous breathing, nonlabored ventilation and respiratory function stable Cardiovascular status: stable Postop Assessment: no headache, no backache and epidural receding Anesthetic complications: no   No complications documented.  Last Vitals:  Vitals:   08/27/20 2126 08/28/20 0512  BP: (!) 104/52 129/70  Pulse: 98 74  Resp: 18 18  Temp: 36.7 C 36.7 C  SpO2: 97%     Last Pain:  Vitals:   08/28/20 0909  TempSrc:   PainSc: 3    Pain Goal:                Epidural/Spinal Function Cutaneous sensation: Normal sensation (08/28/20 0909)  Rayvon Char

## 2020-08-29 ENCOUNTER — Other Ambulatory Visit (HOSPITAL_COMMUNITY): Payer: BC Managed Care – PPO | Attending: Obstetrics and Gynecology

## 2020-08-29 ENCOUNTER — Ambulatory Visit: Payer: Self-pay

## 2020-08-29 MED ORDER — ACETAMINOPHEN ER 650 MG PO TBCR: 650.0000 mg | EXTENDED_RELEASE_TABLET | Freq: Three times a day (TID) | ORAL | 3 refills | Status: AC | PRN

## 2020-08-29 MED ORDER — OXYCODONE HCL 5 MG PO TABS: 5.0000 mg | ORAL_TABLET | ORAL | 0 refills | Status: AC | PRN

## 2020-08-29 NOTE — Lactation Note (Signed)
This note was copied from a baby's chart. Lactation Consultation Note  Patient Name: Girl Ivanka Kirshner [id number] Date: 08/29/2020 Reason for consult: Follow-up assessment;Hyperbilirubinemia Age:38 hours  Follow up to 74 hours old infant with 6.58% weight loss at the time of visit. Parents states infant is feeding well, mostly formula ~77mL per feeding. Mother reports sore, slanted nipple after latching infant.  Reviewed neck extension and back support. Reinforced deep latch, nipple to nose and good alignment. Discussed pacing while bottle-feeding, upright position and frequent burping. Demonstrated swaddling and changed void.  Feeding plan:  1. Breastfeed following hunger cues.  2. Keep infant awake during breastfeeding session: massaging breast, infant's hand/shoulder/feet 3. Offer breast 8-12 times in 24h period to establish good milk supply.   4. Supplement following guidelines, paced bottle feeding and fullness cues.   5. Monitor voids and stools as signs good intake 6. Encouraged maternal rest, hydration and food intake.  7. Contact Lactation Services or local resources for support, questions or concerns.    All questions answered at this time.    Feeding Mother's Current Feeding Choice: Breast Milk and Formula Nipple Type: Slow - flow  Interventions Interventions: Breast feeding basics reviewed;Skin to skin;Breast massage;Hand express;Expressed milk;Adjust position;Education  Consult Status Consult Status: Follow-up Date: 08/30/20 Follow-up type: In-patient    Loretta Doutt A Higuera Ancidey 08/29/2020, 5:20 PM

## 2020-08-29 NOTE — Discharge Summary (Signed)
Postpartum Discharge Summary  Date of Service: 08/29/20     Patient Name: Elizabeth Galvan DOB: Apr 22, 1980 MRN: [id number]  Date of admission: 08/26/2020 Delivery date:08/27/2020  Delivering provider: Everett Graff  Date of discharge: 08/29/2020  Admitting diagnosis: Normal labor and delivery [O80] Intrauterine pregnancy: [redacted]w[redacted]d     Secondary diagnosis:  Active Problems:   Normal labor and delivery Gestational Hypertension Additional problems: History of gastric sleeve, History of pre-gestational DM, Pituitary adenoma, Advanced maternal age, Obesity (BMI 46), Migraines with aura, COVID-19 this pregnancy (resolved) Discharge diagnosis: Term Pregnancy Delivered                                              Post partum procedures:None Augmentation: Pitocin Complications: None  Hospital course: Induction of Labor With Vaginal Delivery   40 y.o. yo G1P1001 at [redacted]w[redacted]d was admitted to the hospital 08/26/2020 for induction of labor.  Indication for induction: PROM.  Patient had an uncomplicated labor course as follows: Membrane Rupture Time/Date: 1:00 AM ,08/26/2020   Delivery Method:Vaginal, Vacuum (Extractor)  Episiotomy: None  Lacerations:  2nd degree  Details of delivery can be found in separate delivery note.  Patient had a routine postpartum course. She was discharged with Roxicodone for breakthrough pain for her second degree perineal laceration. Patient is discharged home 08/29/20.  Newborn Data: Birth date:08/27/2020  Birth time:5:25 AM  Gender:Female  Living status:Living  Apgars:8 ,9  Weight:3586 g   Magnesium Sulfate received: No BMZ received: No Rhophylac:N/A MMR:N/A T-DaP:Given prenatally Flu: Yes Transfusion:No  Physical exam  Vitals:   08/28/20 0512 08/28/20 1614 08/28/20 2144 08/29/20 0535  BP: 129/70 (!) 121/52 126/63 118/66  Pulse: 74 88 66 (!) 59  Resp: $Remo'18 18 18 18  'hXlFw$ Temp: 98.1 F (36.7 C) 98.2 F (36.8 C) 99 F (37.2 C) 98.3 F (36.8 C)  TempSrc: Oral  Axillary Oral Oral  SpO2:   100% 99%  Weight:      Height:       General: alert, cooperative and no distress  Cardio:  RRR Lungs:  CTAB, no wheezes/rales/rhonchi Lochia: appropriate Uterine Fundus: firm Incision: N/A DVT Evaluation: No evidence of DVT seen on physical exam. No cords or calf tenderness. Labs: Lab Results  Component Value Date   WBC 15.0 (H) 08/28/2020   HGB 10.8 (L) 08/28/2020   HCT 32.3 (L) 08/28/2020   MCV 88.5 08/28/2020   PLT 221 08/28/2020   CMP Latest Ref Rng & Units 08/26/2020  Glucose 70 - 99 mg/dL 106(H)  BUN 6 - 20 mg/dL 5(L)  Creatinine 0.44 - 1.00 mg/dL 0.65  Sodium 135 - 145 mmol/L 133(L)  Potassium 3.5 - 5.1 mmol/L 3.9  Chloride 98 - 111 mmol/L 106  CO2 22 - 32 mmol/L 19(L)  Calcium 8.9 - 10.3 mg/dL 9.0  Total Protein 6.5 - 8.1 g/dL 6.3(L)  Total Bilirubin 0.3 - 1.2 mg/dL 0.5  Alkaline Phos 38 - 126 U/L 118  AST 15 - 41 U/L 19  ALT 0 - 44 U/L 13   Edinburgh Score: No flowsheet data found.    After visit meds:  Allergies as of 08/29/2020   No Known Allergies     Medication List    STOP taking these medications   aspirin EC 81 MG tablet     TAKE these medications   acetaminophen 650 MG CR tablet Commonly known as: Tylenol 8 Hour  Take 1 tablet (650 mg total) by mouth every 8 (eight) hours as needed for pain.   calcium-vitamin D 500-200 MG-UNIT tablet Commonly known as: OSCAL WITH D Take 1 tablet by mouth.   ferrous sulfate 324 MG Tbec Take 324 mg by mouth.   oxyCODONE 5 MG immediate release tablet Commonly known as: Roxicodone Take 1 tablet (5 mg total) by mouth every 4 (four) hours as needed for up to 3 days for severe pain.   prenatal multivitamin Tabs tablet Take 1 tablet by mouth daily at 12 noon.        Discharge home in stable condition Infant Feeding: Breast Infant Disposition:home with mother Discharge instruction: per After Visit Summary and Postpartum booklet. Activity: Advance as tolerated. Pelvic rest  for 6 weeks.  Diet: routine diet Anticipated Birth Control: None Postpartum Appointment:6 weeks Additional Postpartum F/U: BP check 1 week Future Appointments: Future Appointments  Date Time Provider [place]  08/29/2020  9:45 AM [facility] None   Follow up Visit:  Follow-up Information    Drema [place], DO Follow up in 1 week(s).   Specialty: Obstetrics and Gynecology Why: We will arrange your 1 week blood pressure check. Contact information: [address] [place] [address] [phone]                   08/29/2020 Drema [place], DO

## 2020-08-31 ENCOUNTER — Inpatient Hospital Stay (HOSPITAL_COMMUNITY): Payer: BC Managed Care – PPO

## 2020-08-31 ENCOUNTER — Inpatient Hospital Stay (HOSPITAL_COMMUNITY): Admit: 2020-08-31 | Payer: Self-pay

## 2020-08-31 ENCOUNTER — Inpatient Hospital Stay (HOSPITAL_COMMUNITY)
Admission: AD | Admit: 2020-08-31 | Payer: BC Managed Care – PPO | Source: Home / Self Care | Admitting: Obstetrics and Gynecology

## 2020-08-31 ENCOUNTER — Ambulatory Visit: Payer: Self-pay

## 2020-08-31 LAB — SURGICAL PATHOLOGY

## 2020-08-31 NOTE — Lactation Note (Signed)
This note was copied from a baby's chart. Lactation Consultation Note  Patient Name: Elizabeth Galvan [id number]'Y Date: 08/31/2020 Reason for consult: Follow-up assessment;Hyperbilirubinemia;Other (Comment);Primapara;1st time breastfeeding;Early term 37-38.6wks (DAT (+), AMA, gastric sleeve) Age:38 days  Visited with mom of 20 days old ETI female, she's a P1. Parents have been mainly formula feeding due to hyperbilirubinemia, and mom has not been pumping consistently. Explained to mom the importance of consistent pumping to protect her supply.  She had a gastric sleeve in 2018 and voiced she's been taking all her vitamins/supplements when asked. Explained to mom the importance of continuing doing so during the lactating phase. She has a pump at home, advised her to start pumping ASAP and to always use her EBM first prior using any formula.  Encouraged 8-12 feedings on cues in 24 hours and pumping every 3 hours after feedings and whenever baby is getting formula. Mom and baby are going home today, baby is at 5% weight loss. Reviewed discharge education, supply/demand, newborn jaundice and pumping schedule.  FOB present and supportive. Parents reported all questions and concerns were answered, they're both aware of [facility] OP services and will call PRN.  Maternal Data    Feeding Mother's Current Feeding Choice: Breast Milk and Formula  Lactation Tools Discussed/Used Tools: Pump Breast pump type: Double-Electric Breast Pump Pump Education: Setup, frequency, and cleaning;Milk Storage Reason for Pumping: ETI, hyperlibirubinemia Pumping frequency: q 3 hours (but hasn't been pumping consistently)  Interventions Interventions: Breast feeding basics reviewed;DEBP;Education  Discharge Discharge Education: Engorgement and breast care;Warning signs for feeding baby;Outpatient recommendation Pump: DEBP;Personal (DEBP at home)  Consult Status Consult Status: Complete Date: 08/31/20 Follow-up type:  Call as needed    [place] 08/31/2020, 3:00 PM

## 2020-09-02 ENCOUNTER — Ambulatory Visit: Payer: Self-pay

## 2020-09-02 NOTE — Lactation Note (Signed)
This note was copied from a baby's chart. Lactation Consultation Note  Patient Name: Elizabeth Galvan [id number] Date: 09/02/2020   Age:38 days  Patient's mother has been pumping q3 hrs since infant's readmission to the hospital. Mom was unsure if she was using the correct flange. Mom says she is able to express a total of 12-15 mL with pumping (although she did get 30 mL once last night). Mom had not been pumping at home.  I went to visit Mom & noted that she had been using size 27 flanges. Size 24 flanges were used with good results. Mom was able to express a total of 25 mL. Mom says she has a [employer]inoh pump at home. I showed Mom how to wash the pump parts & the hand-out from the CDC, "How to Keep Your Breast Pump Kit Clean," was provided to Mom.   I noted that Mom was taking Macrobid. Mom said she began taking it for a UTI on Tuesday, 5/24. Per Lact Med NIH, infants of breastfeeding mothers should only use if infants are older than 68 days old.   I spoke with Elizabeth Rosser, NP about the above recommendation. It was decided for Mom to pump and dump until infant has finished 8 days of life. Mom was agreeable. Mom knows to pump whenever infant receives formula.   Mom has a hx of a pituitary adenoma.    Elizabeth Galvan [facility] 09/02/2020, 2:24 PM

## 2020-12-01 NOTE — Progress Notes (Signed)
Virtual Visit via Telephone Note  I connected with Elizabeth Galvan, on 12/05/2020 at 1:31 PM by telephone due to the COVID-19 pandemic and verified that I am speaking with the correct person using two identifiers.  Due to current restrictions/limitations of in-office visits due to the COVID-19 pandemic, this scheduled clinical appointment was converted to a telehealth visit.   Consent: I discussed the limitations, risks, security and privacy concerns of performing an evaluation and management service by telephone and the availability of in person appointments. I also discussed with the patient that there may be a patient responsible charge related to this service. The patient expressed understanding and agreed to proceed.   Location of Patient: Home  Location of Provider: [facility] [place] participating in Telemedicine visit: [place] Cir[place] Durene Fruits, NP Elmon Else, CMA   History of Present Illness: Elizabeth Galvan is a 40 year-old female who presents to establish care.   History of pituitary producing excess prolactin. Began 3 to 4 years ago. Was seen at Endocrinology at least once every year for MRI and blood work. When she became pregnant around September 2021 she was discontinued off medication for pituitary and headaches. Delivered baby in May 2022. Ready for referral back to Endocrinology.   Concerns for cramping with periods taking Ibuprofen 650 mg without much relief. Unable to take Ibuprofen related to history of gastric sleeve surgery. LMP began 1 day ago. She is breastfeeding.      Past Medical History:  Diagnosis Date   Headache    migraines   Obesity    Ovarian cyst    Pituitary adenoma ([place])    No Known Allergies  Current Outpatient Medications on File Prior to Visit  Medication Sig Dispense Refill   acetaminophen (TYLENOL 8 HOUR) 650 MG CR tablet Take 1 tablet (650 mg total) by mouth every 8 (eight) hours as needed for  pain. 60 tablet 3   calcium-vitamin D (OSCAL WITH D) 500-200 MG-UNIT tablet Take 1 tablet by mouth.     ferrous sulfate 324 MG TBEC Take 324 mg by mouth.     Prenatal Vit-Fe Fumarate-FA (PRENATAL MULTIVITAMIN) TABS tablet Take 1 tablet by mouth daily at 12 noon.     No current facility-administered medications on file prior to visit.    Observations/Objective: Alert and oriented x 3. Not in acute distress. Physical examination not completed as this is a telemedicine visit.  Assessment and Plan: 1. Encounter to establish care: - Patient presents today to establish care.  - Return for annual physical examination, labs, and health maintenance. Arrive fasting meaning having no food for at least 8 hours prior to appointment. You may have only water or black coffee. Please take scheduled medications as normal.  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  - Ambulatory referral to Endocrinology  3. Dysmenorrhea: - Unable to take NSAID's related to history of gastric sleeve.  - Acetaminophen providing no relief.  - Patient plans to follow-up with established Gynecologist.   Follow Up Instructions: Return for annual physical exam.   Patient was given clear instructions to go to Emergency Department or return to medical center if symptoms don't improve, worsen, or new problems develop.The patient verbalized understanding.  I discussed the assessment and treatment plan with the patient. The patient was provided an opportunity to ask questions and all were answered. The patient agreed with the plan and demonstrated an understanding of the instructions.  The patient was advised to call back or seek an in-person evaluation if the symptoms worsen or if the condition fails to improve as anticipated.    I provided 10 minutes total of non-face-to-face time during this encounter.   Ziyanna Tolin Zachery Dauer, NP  [facility] at  [facility] [place], [place] 12/05/2020, 1:31 PM

## 2020-12-05 ENCOUNTER — Telehealth (INDEPENDENT_AMBULATORY_CARE_PROVIDER_SITE_OTHER): Payer: BC Managed Care – PPO | Admitting: Family

## 2020-12-05 ENCOUNTER — Other Ambulatory Visit: Payer: Self-pay

## 2020-12-05 DIAGNOSIS — D352 Benign neoplasm of pituitary gland: Secondary | ICD-10-CM

## 2020-12-05 DIAGNOSIS — Z7689 Persons encountering health services in other specified circumstances: Secondary | ICD-10-CM

## 2020-12-05 DIAGNOSIS — N946 Dysmenorrhea, unspecified: Secondary | ICD-10-CM | POA: Diagnosis not present

## 2020-12-10 ENCOUNTER — Encounter: Payer: Self-pay | Admitting: Family

## 2020-12-16 ENCOUNTER — Ambulatory Visit: Payer: BC Managed Care – PPO | Admitting: Family

## 2020-12-19 ENCOUNTER — Encounter: Payer: Self-pay | Admitting: Nurse Practitioner

## 2020-12-19 ENCOUNTER — Other Ambulatory Visit: Payer: Self-pay

## 2020-12-19 ENCOUNTER — Ambulatory Visit (INDEPENDENT_AMBULATORY_CARE_PROVIDER_SITE_OTHER): Payer: BC Managed Care – PPO | Admitting: Nurse Practitioner

## 2020-12-19 DIAGNOSIS — R52 Pain, unspecified: Secondary | ICD-10-CM | POA: Insufficient documentation

## 2020-12-19 MED ORDER — IBUPROFEN 600 MG PO TABS: 600.0000 mg | ORAL_TABLET | Freq: Three times a day (TID) | ORAL | 0 refills | Status: DC | PRN

## 2020-12-19 MED ORDER — TIZANIDINE HCL 4 MG PO TABS: 4.0000 mg | ORAL_TABLET | Freq: Three times a day (TID) | ORAL | 0 refills | Status: DC | PRN

## 2020-12-19 NOTE — Patient Instructions (Addendum)
Body Aches:  Stay active - may start walking routine and strengthening exercises  Stay well hydrated  Will order ibuprofen  Will order muscle relaxer  Follow up:  Follow up with Amy as scheduled  Joint Pain Joint pain can be caused by many things. It is likely to go away if you follow instructions from your doctor for taking care of yourself at home. Sometimes, you may need more treatment. Follow these instructions at home: Managing pain, stiffness, and swelling   If told, put ice on the painful area. To do this: If you have a removable elastic bandage, sling, or splint, take it off as told by your doctor. Put ice in a plastic bag. Place a towel between your skin and the bag. Leave the ice on for 20 minutes, 2-3 times a day. Take off the ice if your skin turns bright red. This is very important. If you cannot feel pain, heat, or cold, you have a greater risk of damage to the area. Move your fingers or toes below the painful joint often. Raise the painful joint above the level of your heart while you are sitting or lying down. If told, put heat on the painful area. Do this as often as told by your doctor. Use the heat source that your doctor recommends, such as a moist heat pack or a heating pad. Place a towel between your skin and the heat source. Leave the heat on for 20-30 minutes. Take off the heat if your skin gets bright red. This is especially important if you are unable to feel pain, heat, or cold. You may have a greater risk of getting burned. Activity Rest the painful joint for as long as told by your doctor. Do not do things that cause pain or make your pain worse. Begin exercising or stretching the affected area, as told by your doctor. Ask your doctor what types of exercise are safe for you. Return to your normal activities when your doctor says that it is safe. If you have an elastic bandage, sling, or splint: Wear it as told by your doctor. Take it only as told by  your doctor. Loosen it your fingers or toes below the joint: Tingle. Become numb. Get cold and blue. Keep it clean. Ask your doctor if you should take it off before bathing. If it is not waterproof: Do not let it get wet. Cover it with a watertight covering when you take a bath or shower. General instructions Take over-the-counter and prescription medicines only as told by your doctor. This may include medicines taken by mouth or applied to the skin. Do not smoke or use any products that contain nicotine or tobacco. If you need help quitting, ask your doctor. Keep all follow-up visits as told by your doctor. This is important. Contact a doctor if: You have pain that gets worse and does not get better with medicine. Your joint pain does not get better in 3 days. You have more bruising or swelling. You have a fever. You lose 10 lb (4.5 kg) or more without trying. Get help right away if: You cannot move the joint. Your fingers or toes tingle, become numb. or get cold and blue. You have a fever along with a joint that is red, warm, and swollen. Summary Joint pain can be caused by many things. It often goes away if you follow instructions from your doctor for taking care of yourself at home. Rest the painful joint for as long as told.  Do not do things that cause pain or make your pain worse. Take over-the-counter and prescription medicines only as told by your doctor. This information is not intended to replace advice given to you by your health care provider. Make sure you discuss any questions you have with your health care provider. Document Revised: 07/08/2019 Document Reviewed: 07/08/2019 Elsevier Patient Education  2022 [employer].

## 2020-12-19 NOTE — Progress Notes (Signed)
Virtual Visit via Telephone Note  I connected with Elizabeth Galvan on 12/19/20 at  8:50 AM EDT by telephone and verified that I am speaking with the correct person using two identifiers.  Location: Patient: home Provider: office   I discussed the limitations, risks, security and privacy concerns of performing an evaluation and management service by telephone and the availability of in person appointments. I also discussed with the patient that there may be a patient responsible charge related to this service. The patient expressed understanding and agreed to proceed.   History of Present Illness:  Patient presents today for televisit.  Patient states that she has been having increased body aches for the past few weeks.  She states pain is more in her joints.  She has pain especially in her knees upon standing.  Patient does have a job that requires heavy lifting.  Patient did just have a baby 3 months ago.  Patient is scheduled for a physical with blood work in October. Denies f/c/s, n/v/d, hemoptysis, PND, chest pain or edema     Observations/Objective:  Vitals with BMI 08/29/2020 08/28/2020 08/28/2020  Height - - -  Weight - - -  BMI - - -  Systolic [id number]  Diastolic 66 63 52  Pulse 59 66 88      Assessment and Plan:  Body Aches:  Stay active - may start walking routine and strengthening exercises  Stay well hydrated  Will order ibuprofen  Will order muscle relaxer  Follow up:  Follow up with Amy as scheduled  Patient Instructions  Body Aches:  Stay active - may start walking routine and strengthening exercises  Stay well hydrated  Will order ibuprofen  Will order muscle relaxer  Follow up:  Follow up with Amy as scheduled  Joint Pain Joint pain can be caused by many things. It is likely to go away if you follow instructions from your doctor for taking care of yourself at home. Sometimes, you may need more treatment. Follow these instructions at  home: Managing pain, stiffness, and swelling   If told, put ice on the painful area. To do this: If you have a removable elastic bandage, sling, or splint, take it off as told by your doctor. Put ice in a plastic bag. Place a towel between your skin and the bag. Leave the ice on for 20 minutes, 2-3 times a day. Take off the ice if your skin turns bright red. This is very important. If you cannot feel pain, heat, or cold, you have a greater risk of damage to the area. Move your fingers or toes below the painful joint often. Raise the painful joint above the level of your heart while you are sitting or lying down. If told, put heat on the painful area. Do this as often as told by your doctor. Use the heat source that your doctor recommends, such as a moist heat pack or a heating pad. Place a towel between your skin and the heat source. Leave the heat on for 20-30 minutes. Take off the heat if your skin gets bright red. This is especially important if you are unable to feel pain, heat, or cold. You may have a greater risk of getting burned. Activity Rest the painful joint for as long as told by your doctor. Do not do things that cause pain or make your pain worse. Begin exercising or stretching the affected area, as told by your doctor. Ask your doctor what types of exercise  are safe for you. Return to your normal activities when your doctor says that it is safe. If you have an elastic bandage, sling, or splint: Wear it as told by your doctor. Take it only as told by your doctor. Loosen it your fingers or toes below the joint: Tingle. Become numb. Get cold and blue. Keep it clean. Ask your doctor if you should take it off before bathing. If it is not waterproof: Do not let it get wet. Cover it with a watertight covering when you take a bath or shower. General instructions Take over-the-counter and prescription medicines only as told by your doctor. This may include medicines taken by  mouth or applied to the skin. Do not smoke or use any products that contain nicotine or tobacco. If you need help quitting, ask your doctor. Keep all follow-up visits as told by your doctor. This is important. Contact a doctor if: You have pain that gets worse and does not get better with medicine. Your joint pain does not get better in 3 days. You have more bruising or swelling. You have a fever. You lose 10 lb (4.5 kg) or more without trying. Get help right away if: You cannot move the joint. Your fingers or toes tingle, become numb. or get cold and blue. You have a fever along with a joint that is red, warm, and swollen. Summary Joint pain can be caused by many things. It often goes away if you follow instructions from your doctor for taking care of yourself at home. Rest the painful joint for as long as told. Do not do things that cause pain or make your pain worse. Take over-the-counter and prescription medicines only as told by your doctor. This information is not intended to replace advice given to you by your health care provider. Make sure you discuss any questions you have with your health care provider. Document Revised: 07/08/2019 Document Reviewed: 07/08/2019 Elsevier Patient Education  2022 [employer].       I discussed the assessment and treatment plan with the patient. The patient was provided an opportunity to ask questions and all were answered. The patient agreed with the plan and demonstrated an understanding of the instructions.   The patient was advised to call back or seek an in-person evaluation if the symptoms worsen or if the condition fails to improve as anticipated.  I provided 23 minutes of non-face-to-face time during this encounter.   Fenton Foy, NP

## 2021-01-06 NOTE — Progress Notes (Signed)
Patient ID: Elizabeth Galvan, female    DOB: 1980-11-14  MRN: [id number]  CC: Annual Physical Exam  Subjective: Kayah Hecker is a 40 y.o. female who presents for annual physical exam.   Her concerns today include:  Reports had PAP smear recently at [facility], declines today.   Right shoulder pain began this morning. Denies trauma and injury. She is right hand dominant.   Bilateral ears with swishing sounds. Endorses some decreased hearing, not ready for referral to ENT as of present. Denies pain, drainage, and digging in ears. Reports happened in the past and prescribed allergy medication and nasal spray which helped.   Reports gastric sleeve surgery several years ago and lost 100 pounds. Recently more abdominal bloating and gas. Bowel movements are normal without blood in the stool.   She is not breastfeeding.   Patient Active Problem List   Diagnosis Date Noted   Generalized body aches 12/19/2020   Normal labor and delivery 08/26/2020     Current Outpatient Medications on File Prior to Visit  Medication Sig Dispense Refill   acetaminophen (TYLENOL 8 HOUR) 650 MG CR tablet Take 1 tablet (650 mg total) by mouth every 8 (eight) hours as needed for pain. 60 tablet 3   calcium-vitamin D (OSCAL WITH D) 500-200 MG-UNIT tablet Take 1 tablet by mouth.     ferrous sulfate 324 MG TBEC Take 324 mg by mouth.     Prenatal Vit-Fe Fumarate-FA (PRENATAL MULTIVITAMIN) TABS tablet Take 1 tablet by mouth daily at 12 noon.     No current facility-administered medications on file prior to visit.    No Known Allergies  Social History   Socioeconomic History   Marital status: Married    Spouse name: Not on file   Number of children: Not on file   Years of education: Not on file   Highest education level: Not on file  Occupational History   Not on file  Tobacco Use   Smoking status: Never   Smokeless tobacco: Never  Vaping Use   Vaping Use: Never used  Substance and Sexual Activity    Alcohol use: Never   Drug use: Never   Sexual activity: Yes    Birth control/protection: None  Other Topics Concern   Not on file  Social History Narrative   Not on file   Social Determinants of Health   Financial Resource Strain: Not on file  Food Insecurity: Not on file  Transportation Needs: Not on file  Physical Activity: Not on file  Stress: Not on file  Social Connections: Not on file  Intimate Partner Violence: Not on file    Family History  Problem Relation Age of Onset   Diabetes Mother    Hypertension Mother    Varicose Veins Mother     Past Surgical History:  Procedure Laterality Date   LAPAROSCOPIC GASTRIC SLEEVE RESECTION     OVARIAN CYST REMOVAL      ROS: Review of Systems Negative except as stated above  PHYSICAL EXAM: BP 104/70 (BP Location: Left Arm, Patient Position: Sitting, Cuff Size: Large)   Pulse 76   Temp 98.3 F (36.8 C)   Resp 16   Ht 5' 4.92" (1.649 m)   Wt 256 lb 6.4 oz (116.3 kg)   SpO2 98%   BMI 42.77 kg/m   Physical Exam HENT:     Head: Normocephalic and atraumatic.     Right Ear: Tympanic membrane, ear canal and external ear normal.  Left Ear: Tympanic membrane, ear canal and external ear normal.     Nose: Nose normal.     Mouth/Throat:     Mouth: Mucous membranes are moist.     Pharynx: Oropharynx is clear.  Eyes:     Extraocular Movements: Extraocular movements intact.     Conjunctiva/sclera: Conjunctivae normal.     Pupils: Pupils are equal, round, and reactive to light.  Cardiovascular:     Rate and Rhythm: Normal rate and regular rhythm.     Pulses: Normal pulses.     Heart sounds: Normal heart sounds.  Pulmonary:     Effort: Pulmonary effort is normal.     Breath sounds: Normal breath sounds.  Chest:     Comments: Patient declined exam.  Abdominal:     General: Bowel sounds are normal.     Palpations: Abdomen is soft.  Genitourinary:    Comments: Patient declined exam.  Musculoskeletal:         General: Normal range of motion.     Cervical back: Normal range of motion and neck supple.  Skin:    General: Skin is warm and dry.     Capillary Refill: Capillary refill takes less than 2 seconds.  Neurological:     General: No focal deficit present.     Mental Status: She is alert and oriented to person, place, and time.  Psychiatric:        Mood and Affect: Mood normal.        Behavior: Behavior normal.   ASSESSMENT AND PLAN: 1. Annual physical exam: - Counseled on 150 minutes of exercise per week as tolerated, healthy eating (including decreased daily intake of saturated fats, cholesterol, added sugars, sodium), STI prevention, and routine healthcare maintenance.  2. Screening for metabolic disorder: - DZH29+JMEQ to check kidney function, liver function, and electrolyte balance.  - CMP14+EGFR  3. Screening for deficiency anemia: - CBC to screen for anemia. - CBC  4. Diabetes mellitus screening: - Hemoglobin A1c to screen for pre-diabetes/diabetes. - Hemoglobin A1c  5. Screening cholesterol level: - Lipid panel to screen for high cholesterol.  - Lipid panel  6. Thyroid disorder screen: - TSH to check thyroid function.  - TSH  7. Need for hepatitis C screening test: - Hepatitis C antibody to screen for hepatitis C.  - Hepatitis C Antibody  8. Pap smear for cervical cancer screening: 9. Routine screening for STI (sexually transmitted infection): - Patient declined, reports recently completed at [facility].   10. Acute pain of right shoulder: - Begin Ibuprofen as prescribed.  - Follow-up with primary provider in 4 weeks or sooner if needed.  - ibuprofen (ADVIL) 600 MG tablet; Take 1 tablet (600 mg total) by mouth every 8 (eight) hours as needed.  Dispense: 30 tablet; Refill: 0  11. Perennial allergic rhinitis: - Begin Loratadine and Fluticasone as prescribed.  - Follow-up with primary provider as scheduled.  - loratadine (CLARITIN) 10 MG  tablet; Take 1 tablet (10 mg total) by mouth daily.  Dispense: 30 tablet; Refill: 4 - fluticasone (FLONASE) 50 MCG/ACT nasal spray; Place 2 sprays into both nostrils daily.  Dispense: 16 g; Refill: 0    Patient was given the opportunity to ask questions.  Patient verbalized understanding of the plan and was able to repeat key elements of the plan. Patient was given clear instructions to go to Emergency Department or return to medical center if symptoms don't improve, worsen, or new problems develop.The patient verbalized understanding.  Orders Placed This Encounter  Procedures   Hepatitis C Antibody   CBC   Lipid panel   TSH   CMP14+EGFR   Hemoglobin A1c     Requested Prescriptions   Signed Prescriptions Disp Refills   ibuprofen (ADVIL) 600 MG tablet 30 tablet 0    Sig: Take 1 tablet (600 mg total) by mouth every 8 (eight) hours as needed.   loratadine (CLARITIN) 10 MG tablet 30 tablet 4    Sig: Take 1 tablet (10 mg total) by mouth daily.   fluticasone (FLONASE) 50 MCG/ACT nasal spray 16 g 0    Sig: Place 2 sprays into both nostrils daily.    Return in about 1 year (around 01/10/2022) for Physical per patient preference.  Camillia Herter, NP

## 2021-01-10 ENCOUNTER — Other Ambulatory Visit: Payer: Self-pay

## 2021-01-10 ENCOUNTER — Encounter: Payer: Self-pay | Admitting: Family

## 2021-01-10 ENCOUNTER — Ambulatory Visit (INDEPENDENT_AMBULATORY_CARE_PROVIDER_SITE_OTHER): Payer: BC Managed Care – PPO | Admitting: Family

## 2021-01-10 VITALS — BP 104/70 | HR 76 | Temp 98.3°F | Resp 16 | Ht 64.92 in | Wt 256.4 lb

## 2021-01-10 DIAGNOSIS — Z1329 Encounter for screening for other suspected endocrine disorder: Secondary | ICD-10-CM

## 2021-01-10 DIAGNOSIS — Z130 Encounter for screening for diseases of the blood and blood-forming organs and certain disorders involving the immune mechanism: Secondary | ICD-10-CM

## 2021-01-10 DIAGNOSIS — Z0000 Encounter for general adult medical examination without abnormal findings: Secondary | ICD-10-CM

## 2021-01-10 DIAGNOSIS — Z1159 Encounter for screening for other viral diseases: Secondary | ICD-10-CM

## 2021-01-10 DIAGNOSIS — M25511 Pain in right shoulder: Secondary | ICD-10-CM

## 2021-01-10 DIAGNOSIS — Z13220 Encounter for screening for lipoid disorders: Secondary | ICD-10-CM

## 2021-01-10 DIAGNOSIS — Z113 Encounter for screening for infections with a predominantly sexual mode of transmission: Secondary | ICD-10-CM

## 2021-01-10 DIAGNOSIS — Z131 Encounter for screening for diabetes mellitus: Secondary | ICD-10-CM

## 2021-01-10 DIAGNOSIS — Z13228 Encounter for screening for other metabolic disorders: Secondary | ICD-10-CM | POA: Diagnosis not present

## 2021-01-10 DIAGNOSIS — Z124 Encounter for screening for malignant neoplasm of cervix: Secondary | ICD-10-CM

## 2021-01-10 DIAGNOSIS — J3089 Other allergic rhinitis: Secondary | ICD-10-CM

## 2021-01-10 MED ORDER — LORATADINE 10 MG PO TABS
10.0000 mg | ORAL_TABLET | Freq: Every day | ORAL | 4 refills | Status: AC
Start: 2021-01-10 — End: ?

## 2021-01-10 MED ORDER — IBUPROFEN 600 MG PO TABS: 600.0000 mg | ORAL_TABLET | Freq: Three times a day (TID) | ORAL | 0 refills | Status: DC | PRN

## 2021-01-10 MED ORDER — FLUTICASONE PROPIONATE 50 MCG/ACT NA SUSP: 2.0000 | Freq: Every day | NASAL | 0 refills | Status: DC

## 2021-01-10 NOTE — Progress Notes (Signed)
Pt presents for annual physical exam declines pap, completed with Drema Dallas, @[facility]. Pt reports swishing sound in ears and right shoulder pain

## 2021-01-10 NOTE — Patient Instructions (Signed)
Preventive Care 21-39 Years Old, Female Preventive care refers to lifestyle choices and visits with your health care provider that can promote health and wellness. This includes: A yearly physical exam. This is also called an annual wellness visit. Regular dental and eye exams. Immunizations. Screening for certain conditions. Healthy lifestyle choices, such as: Eating a healthy diet. Getting regular exercise. Not using drugs or products that contain nicotine and tobacco. Limiting alcohol use. What can I expect for my preventive care visit? Physical exam Your health care provider may check your: Height and weight. These may be used to calculate your BMI (body mass index). BMI is a measurement that tells if you are at a healthy weight. Heart rate and blood pressure. Body temperature. Skin for abnormal spots. Counseling Your health care provider may ask you questions about your: Past medical problems. Family's medical history. Alcohol, tobacco, and drug use. Emotional well-being. Home life and relationship well-being. Sexual activity. Diet, exercise, and sleep habits. Work and work environment. Access to firearms. Method of birth control. Menstrual cycle. Pregnancy history. What immunizations do I need? Vaccines are usually given at various ages, according to a schedule. Your health care provider will recommend vaccines for you based on your age, medical history, and lifestyle or other factors, such as travel or where you work. What tests do I need? Blood tests Lipid and cholesterol levels. These may be checked every 5 years starting at age 20. Hepatitis C test. Hepatitis B test. Screening Diabetes screening. This is done by checking your blood sugar (glucose) after you have not eaten for a while (fasting). STD (sexually transmitted disease) testing, if you are at risk. BRCA-related cancer screening. This may be done if you have a family history of breast, ovarian, tubal, or  peritoneal cancers. Pelvic exam and Pap test. This may be done every 3 years starting at age 21. Starting at age 30, this may be done every 5 years if you have a Pap test in combination with an HPV test. Talk with your health care provider about your test results, treatment options, and if necessary, the need for more tests. Follow these instructions at home: Eating and drinking  Eat a healthy diet that includes fresh fruits and vegetables, whole grains, lean protein, and low-fat dairy products. Take vitamin and mineral supplements as recommended by your health care provider. Do not drink alcohol if: Your health care provider tells you not to drink. You are pregnant, may be pregnant, or are planning to become pregnant. If you drink alcohol: Limit how much you have to 0-1 drink a day. Be aware of how much alcohol is in your drink. In the [place] one drink equals one 12 oz bottle of beer (355 mL), one 5 oz glass of wine (148 mL), or one 1 oz glass of hard liquor (44 mL). Lifestyle Take daily care of your teeth and gums. Brush your teeth every morning and night with fluoride toothpaste. Floss one time each day. Stay active. Exercise for at least 30 minutes 5 or more days each week. Do not use any products that contain nicotine or tobacco, such as cigarettes, e-cigarettes, and chewing tobacco. If you need help quitting, ask your health care provider. Do not use drugs. If you are sexually active, practice safe sex. Use a condom or other form of protection to prevent STIs (sexually transmitted infections). If you do not wish to become pregnant, use a form of birth control. If you plan to become pregnant, see your health care provider   for a prepregnancy visit. Find healthy ways to cope with stress, such as: Meditation, yoga, or listening to music. Journaling. Talking to a trusted person. Spending time with friends and family. Safety Always wear your seat belt while driving or riding in a  vehicle. Do not drive: If you have been drinking alcohol. Do not ride with someone who has been drinking. When you are tired or distracted. While texting. Wear a helmet and other protective equipment during sports activities. If you have firearms in your house, make sure you follow all gun safety procedures. Seek help if you have been physically or sexually abused. What's next? Go to your health care provider once a year for an annual wellness visit. Ask your health care provider how often you should have your eyes and teeth checked. Stay up to date on all vaccines. This information is not intended to replace advice given to you by your health care provider. Make sure you discuss any questions you have with your health care provider. Document Revised: 06/03/2020 Document Reviewed: 12/05/2017 Elsevier Patient Education  2022 Elsevier Inc[employer]  

## 2021-01-11 ENCOUNTER — Other Ambulatory Visit: Payer: Self-pay | Admitting: Family

## 2021-01-11 DIAGNOSIS — R7303 Prediabetes: Secondary | ICD-10-CM

## 2021-01-11 DIAGNOSIS — E785 Hyperlipidemia, unspecified: Secondary | ICD-10-CM

## 2021-01-11 LAB — CMP14+EGFR
ALT: 18 IU/L (ref 0–32)
AST: 20 IU/L (ref 0–40)
Albumin/Globulin Ratio: 1.8 (ref 1.2–2.2)
Albumin: 4.6 g/dL (ref 3.8–4.8)
Alkaline Phosphatase: 58 IU/L (ref 44–121)
BUN/Creatinine Ratio: 10 (ref 9–23)
BUN: 7 mg/dL (ref 6–20)
Bilirubin Total: 0.3 mg/dL (ref 0.0–1.2)
CO2: 21 mmol/L (ref 20–29)
Calcium: 9.7 mg/dL (ref 8.7–10.2)
Chloride: 99 mmol/L (ref 96–106)
Creatinine, Ser: 0.67 mg/dL (ref 0.57–1.00)
Globulin, Total: 2.6 g/dL (ref 1.5–4.5)
Glucose: 106 mg/dL — ABNORMAL HIGH (ref 70–99)
Potassium: 4.3 mmol/L (ref 3.5–5.2)
Sodium: 138 mmol/L (ref 134–144)
Total Protein: 7.2 g/dL (ref 6.0–8.5)
eGFR: 115 mL/min/{1.73_m2} (ref >59)

## 2021-01-11 LAB — HEMOGLOBIN A1C
Est. average glucose Bld gHb Est-mCnc: 128 mg/dL
Hgb A1c MFr Bld: 6.1 % — ABNORMAL HIGH (ref 4.8–5.6)

## 2021-01-11 LAB — LIPID PANEL
Chol/HDL Ratio: 4.5 ratio — ABNORMAL HIGH (ref 0.0–4.4)
Cholesterol, Total: 267 mg/dL — ABNORMAL HIGH (ref 100–199)
HDL: 59 mg/dL (ref >39)
LDL Chol Calc (NIH): 170 mg/dL — ABNORMAL HIGH (ref 0–99)
Triglycerides: 207 mg/dL — ABNORMAL HIGH (ref 0–149)
VLDL Cholesterol Cal: 38 mg/dL (ref 5–40)

## 2021-01-11 LAB — CBC
Hematocrit: 41.3 % (ref 34.0–46.6)
Hemoglobin: 13.8 g/dL (ref 11.1–15.9)
MCH: 28.3 pg (ref 26.6–33.0)
MCHC: 33.4 g/dL (ref 31.5–35.7)
MCV: 85 fL (ref 79–97)
Platelets: 347 10*3/uL (ref 150–450)
RBC: 4.88 x10E6/uL (ref 3.77–5.28)
RDW: 13.5 % (ref 11.7–15.4)
WBC: 7.2 10*3/uL (ref 3.4–10.8)

## 2021-01-11 LAB — TSH: TSH: 2.27 u[IU]/mL (ref 0.450–4.500)

## 2021-01-11 LAB — HEPATITIS C ANTIBODY: Hep C Virus Ab: <0.1 s/co ratio (ref 0.0–0.9)

## 2021-01-11 MED ORDER — ATORVASTATIN CALCIUM 20 MG PO TABS
20.0000 mg | ORAL_TABLET | Freq: Every day | ORAL | 0 refills | Status: DC
Start: 2021-01-11 — End: 2021-07-06

## 2021-01-11 NOTE — Progress Notes (Signed)
Kidney function normal.   Liver function normal.   Thyroid function normal.   No anemia.   Hepatitis C negative.   Cholesterol higher than expected. High cholesterol may increase risk of heart attack and/or stroke. Consider eating more fruits, vegetables, and lean baked meats such as chicken or fish. Moderate intensity exercise at least 150 minutes as tolerated per week may help as well.   Begin Atorvastatin (Lipitor) for high cholesterol. Please call our office and schedule lab only appointment to have cholesterol rechecked in 6 to 8 weeks.   Hemoglobin A1c is consistent with pre-diabetes. Practice healthy eating habits of fresh fruit and vegetables, lean baked meats such as chicken, fish, and [place]; limit breads, rice, pastas, and desserts; practice regular aerobic exercise (at least 150 minutes a week as tolerated). No medication needed at the moment. Encouraged to recheck in 6 months.

## 2021-01-14 ENCOUNTER — Telehealth: Payer: BC Managed Care – PPO | Admitting: Nurse Practitioner

## 2021-01-14 DIAGNOSIS — J069 Acute upper respiratory infection, unspecified: Secondary | ICD-10-CM

## 2021-01-14 MED ORDER — PROMETHAZINE-DM 6.25-15 MG/5ML PO SYRP: 5.0000 mL | ORAL_SOLUTION | Freq: Four times a day (QID) | ORAL | 0 refills | Status: DC | PRN

## 2021-01-14 MED ORDER — AZELASTINE HCL 0.05 % OP SOLN: 1.0000 [drp] | Freq: Two times a day (BID) | OPHTHALMIC | 0 refills | Status: DC

## 2021-01-14 NOTE — Patient Instructions (Signed)
Rodman Pickle, thank you for joining Gildardo Pounds, NP for today's virtual visit.  While this provider is not your primary care provider (PCP), if your PCP is located in our provider database this encounter information will be shared with them immediately following your visit.  Consent: (Patient) Elizabeth Galvan provided verbal consent for this virtual visit at the beginning of the encounter.  Current Medications:  Current Outpatient Medications:    azelastine (OPTIVAR) 0.05 % ophthalmic solution, Place 1 drop into both eyes 2 (two) times daily., Disp: 6 mL, Rfl: 0   promethazine-dextromethorphan (PROMETHAZINE-DM) 6.25-15 MG/5ML syrup, Take 5 mLs by mouth 4 (four) times daily as needed for cough., Disp: 118 mL, Rfl: 0   acetaminophen (TYLENOL 8 HOUR) 650 MG CR tablet, Take 1 tablet (650 mg total) by mouth every 8 (eight) hours as needed for pain., Disp: 60 tablet, Rfl: 3   atorvastatin (LIPITOR) 20 MG tablet, Take 1 tablet (20 mg total) by mouth daily., Disp: 120 tablet, Rfl: 0   calcium-vitamin D (OSCAL WITH D) 500-200 MG-UNIT tablet, Take 1 tablet by mouth., Disp: , Rfl:    ferrous sulfate 324 MG TBEC, Take 324 mg by mouth., Disp: , Rfl:    fluticasone (FLONASE) 50 MCG/ACT nasal spray, Place 2 sprays into both nostrils daily., Disp: 16 g, Rfl: 0   ibuprofen (ADVIL) 600 MG tablet, Take 1 tablet (600 mg total) by mouth every 8 (eight) hours as needed., Disp: 30 tablet, Rfl: 0   loratadine (CLARITIN) 10 MG tablet, Take 1 tablet (10 mg total) by mouth daily., Disp: 30 tablet, Rfl: 4   Prenatal Vit-Fe Fumarate-FA (PRENATAL MULTIVITAMIN) TABS tablet, Take 1 tablet by mouth daily at 12 noon., Disp: , Rfl:    Medications ordered in this encounter:  Meds ordered this encounter  Medications   promethazine-dextromethorphan (PROMETHAZINE-DM) 6.25-15 MG/5ML syrup    Sig: Take 5 mLs by mouth 4 (four) times daily as needed for cough.    Dispense:  118 mL    Refill:  0    Order Specific Question:    Supervising Provider    Answer:   MILLER, BRIAN [[id number]]   azelastine (OPTIVAR) 0.05 % ophthalmic solution    Sig: Place 1 drop into both eyes 2 (two) times daily.    Dispense:  6 mL    Refill:  0    Order Specific Question:   Supervising Provider    Answer:   Sabra Heck, BRIAN [[id number]]     *If you need refills on other medications prior to your next appointment, please contact your pharmacy*  Follow-Up: Call back or seek an in-person evaluation if the symptoms worsen or if the condition fails to improve as anticipated.  Other Instructions Make sure you are taking your allergy medications every day as prescribed INSTRUCTIONS: use a humidifier for nasal congestion Drink plenty of fluids, rest and wash hands frequently to avoid the spread of infection Alternate tylenol and Motrin for relief of fever    If you have been instructed to have an in-person evaluation today at a local Urgent Care facility, please use the link below. It will take you to a list of all of our available [facility], including address, phone number and hours of operation. Please do not delay care.  [facility]  If you or a family member do not have a primary care provider, use the link below to schedule a visit and establish care. When you choose a [facility] primary care  physician or advanced practice provider, you gain a long-term partner in health. Find a Primary Care Provider  Learn more about [facility]'s in-office and virtual care options: [facility]

## 2021-01-14 NOTE — Progress Notes (Signed)
Virtual Visit Consent   Elizabeth Galvan, you are scheduled for a virtual visit with a [facility] provider today.     Just as with appointments in the office, your consent must be obtained to participate.  Your consent will be active for this visit and any virtual visit you may have with one of our providers in the next 365 days.     If you have a MyChart account, a copy of this consent can be sent to you electronically.  All virtual visits are billed to your insurance company just like a traditional visit in the office.    As this is a virtual visit, video technology does not allow for your provider to perform a traditional examination.  This may limit your provider's ability to fully assess your condition.  If your provider identifies any concerns that need to be evaluated in person or the need to arrange testing (such as labs, EKG, etc.), we will make arrangements to do so.     Although advances in technology are sophisticated, we cannot ensure that it will always work on either your end or our end.  If the connection with a video visit is poor, the visit may have to be switched to a telephone visit.  With either a video or telephone visit, we are not always able to ensure that we have a secure connection.     I need to obtain your verbal consent now.   Are you willing to proceed with your visit today?    Nyazia Canevari has provided verbal consent on 01/14/2021 for a virtual visit (video or telephone).   Elizabeth Pounds, NP   Date: 01/14/2021 9:07 AM   Virtual Visit via Video Note   I, Elizabeth Galvan, connected with  Elizabeth Galvan  ([id number], 01/12/1981) on 01/14/21 at  8:45 AM EDT by a video-enabled telemedicine application and verified that I am speaking with the correct person using two identifiers.  Location: Patient: Virtual Visit Location Patient: Home Provider: Virtual Visit Location Provider: Home Office   I discussed the limitations of evaluation and management by telemedicine  and the availability of in person appointments. The patient expressed understanding and agreed to proceed.    History of Present Illness: Elizabeth Galvan is a 40 y.o. who identifies as a female who was assigned female at birth, and is being seen today for URI.  HPI Onset of viral URI symptoms was 2 days ago. Symptoms include: itchy red eyes, stuffy nose, productive cough, sore throat and "strong" headache. Denies fever. Home remedies include Tylenol. She has been vaccinated with COVID vaccine x 1. She does have a history of allergies but her current medications of claritin and flonase ineffective.    Problems:  Patient Active Problem List   Diagnosis Date Noted   Prediabetes 01/11/2021   Hyperlipidemia 01/11/2021   Generalized body aches 12/19/2020   Normal labor and delivery 08/26/2020    Allergies: No Known Allergies Medications:  Current Outpatient Medications:    azelastine (OPTIVAR) 0.05 % ophthalmic solution, Place 1 drop into both eyes 2 (two) times daily., Disp: 6 mL, Rfl: 0   promethazine-dextromethorphan (PROMETHAZINE-DM) 6.25-15 MG/5ML syrup, Take 5 mLs by mouth 4 (four) times daily as needed for cough., Disp: 118 mL, Rfl: 0   acetaminophen (TYLENOL 8 HOUR) 650 MG CR tablet, Take 1 tablet (650 mg total) by mouth every 8 (eight) hours as needed for pain., Disp: 60 tablet, Rfl: 3   atorvastatin (LIPITOR) 20 MG tablet, Take  1 tablet (20 mg total) by mouth daily., Disp: 120 tablet, Rfl: 0   calcium-vitamin D (OSCAL WITH D) 500-200 MG-UNIT tablet, Take 1 tablet by mouth., Disp: , Rfl:    ferrous sulfate 324 MG TBEC, Take 324 mg by mouth., Disp: , Rfl:    fluticasone (FLONASE) 50 MCG/ACT nasal spray, Place 2 sprays into both nostrils daily., Disp: 16 g, Rfl: 0   ibuprofen (ADVIL) 600 MG tablet, Take 1 tablet (600 mg total) by mouth every 8 (eight) hours as needed., Disp: 30 tablet, Rfl: 0   loratadine (CLARITIN) 10 MG tablet, Take 1 tablet (10 mg total) by mouth daily., Disp: 30  tablet, Rfl: 4   Prenatal Vit-Fe Fumarate-FA (PRENATAL MULTIVITAMIN) TABS tablet, Take 1 tablet by mouth daily at 12 noon., Disp: , Rfl:   Observations/Objective: Patient is well-developed, well-nourished in no acute distress.  Resting comfortably at home.  Head is normocephalic, atraumatic.  No labored breathing.  Speech is clear and coherent with logical content.  Patient is alert and oriented at baseline.    Assessment and Plan: 1. Viral URI with cough - promethazine-dextromethorphan (PROMETHAZINE-DM) 6.25-15 MG/5ML syrup; Take 5 mLs by mouth 4 (four) times daily as needed for cough.  Dispense: 118 mL; Refill: 0 - azelastine (OPTIVAR) 0.05 % ophthalmic solution; Place 1 drop into both eyes 2 (two) times daily.  Dispense: 6 mL; Refill: 0 INSTRUCTIONS: use a humidifier for nasal congestion Drink plenty of fluids, rest and wash hands frequently to avoid the spread of infection Alternate tylenol and Motrin for relief of fever   Follow Up Instructions: I discussed the assessment and treatment plan with the patient. The patient was provided an opportunity to ask questions and all were answered. The patient agreed with the plan and demonstrated an understanding of the instructions.  A copy of instructions were sent to the patient via MyChart unless otherwise noted below.     The patient was advised to call back or seek an in-person evaluation if the symptoms worsen or if the condition fails to improve as anticipated.  Time:  I spent 10 minutes with the patient via telehealth technology discussing the above problems/concerns.    Elizabeth Pounds, NP

## 2021-01-20 ENCOUNTER — Ambulatory Visit
Admission: EM | Admit: 2021-01-20 | Discharge: 2021-01-20 | Disposition: A | Discharge status: Home / Self Care | Payer: BC Managed Care – PPO | Attending: Physician Assistant | Admitting: Physician Assistant

## 2021-01-20 ENCOUNTER — Other Ambulatory Visit: Payer: Self-pay

## 2021-01-20 ENCOUNTER — Encounter: Payer: Self-pay | Admitting: Emergency Medicine

## 2021-01-20 DIAGNOSIS — M545 Low back pain, unspecified: Secondary | ICD-10-CM

## 2021-01-20 DIAGNOSIS — M542 Cervicalgia: Secondary | ICD-10-CM

## 2021-01-20 MED ORDER — PREDNISONE 20 MG PO TABS: 40.0000 mg | ORAL_TABLET | Freq: Every day | ORAL | 0 refills | Status: AC

## 2021-01-20 MED ORDER — CYCLOBENZAPRINE HCL 10 MG PO TABS: 10.0000 mg | ORAL_TABLET | Freq: Two times a day (BID) | ORAL | 0 refills | Status: DC | PRN
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
## 2021-01-20 NOTE — Discharge Instructions (Addendum)
Take medication as prescribed. Use caution with cyclobenzaprine (muscle relaxer) as it may cause some drowsiness. You can also use heat to relax muscles. Follow up with any further concerns.
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
## 2021-01-28 ENCOUNTER — Telehealth: Payer: BC Managed Care – PPO | Admitting: Nurse Practitioner

## 2021-01-28 DIAGNOSIS — J029 Acute pharyngitis, unspecified: Secondary | ICD-10-CM

## 2021-01-28 DIAGNOSIS — H5789 Other specified disorders of eye and adnexa: Secondary | ICD-10-CM

## 2021-01-28 MED ORDER — POLYMYXIN B-TRIMETHOPRIM 10000-0.1 UNIT/ML-% OP SOLN
2.0000 [drp] | OPHTHALMIC | 0 refills | Status: DC
Start: 2021-01-28 — End: 2021-03-24

## 2021-01-28 NOTE — Patient Instructions (Signed)
Pharyngitis ?Pharyngitis is a sore throat (pharynx). This is when there is redness, pain, and swelling in your throat. Most of the time, this condition gets better on its own. In some cases, you may need medicine. ?What are the causes? ?An infection from a virus. ?An infection from bacteria. ?Allergies. ?What increases the risk? ?Being 5-38 years old. ?Being in crowded environments. These include: ?Daycares. ?Schools. ?Dormitories. ?Living in a place with cold temperatures outside. ?Having a weakened disease-fighting (immune) system. ?What are the signs or symptoms? ?Symptoms may vary depending on the cause. Common symptoms include: ?Sore throat. ?Tiredness (fatigue). ?Low-grade fever. ?Stuffy nose. ?Cough. ?Headache. ?Other symptoms may include: ?Glands in the neck (lymph nodes) that are swollen. ?Skin rashes. ?Film on the throat or tonsils. This can be caused by an infection from bacteria. ?Vomiting. ?Red, itchy eyes. ?Loss of appetite. ?Joint pain and muscle aches. ?Tonsils that are temporarily bigger than usual (enlarged). ?How is this treated? ?Many times, treatment is not needed. This condition usually gets better in 3-4 days without treatment. ?If the infection is caused by a bacteria, you may be need to take antibiotics. ?Follow these instructions at home: ?Medicines ?Take over-the-counter and prescription medicines only as told by your doctor. ?If you were prescribed an antibiotic medicine, take it as told by your doctor. Do not stop taking the antibiotic even if you start to feel better. ?Use throat lozenges or sprays to soothe your throat as told by your doctor. ?Children can get pharyngitis. Do not give your child aspirin. ?Managing pain ?To help with pain, try: ?Sipping warm liquids, such as: ?Broth. ?Herbal tea. ?Warm water. ?Eating or drinking cold or frozen liquids, such as frozen ice pops. ?Rinsing your mouth (gargle) with a salt water mixture 3-4 times a day or as needed. ?To make salt water,  dissolve ?-1 tsp (3-6 g) of salt in 1 cup (237 mL) of warm water. ?Do not swallow this mixture. ?Sucking on hard candy or throat lozenges. ?Putting a cool-mist humidifier in your bedroom at night to moisten the air. ?Sitting in the bathroom with the door closed for 5-10 minutes while you run hot water in the shower. ? ?General instructions ? ?Do not smoke or use any products that contain nicotine or tobacco. If you need help quitting, ask your doctor. ?Rest as told by your doctor. ?Drink enough fluid to keep your pee (urine) pale yellow. ?How is this prevented? ?Wash your hands often for at least 20 seconds with soap and water. If soap and water are not available, use hand sanitizer. ?Do not touch your eyes, nose, or mouth with unwashed hands. Wash hands after touching these areas. ?Do not share cups or eating utensils. ?Avoid close contact with people who are sick. ?Contact a doctor if: ?You have large, tender lumps in your neck. ?You have a rash. ?You cough up green, yellow-brown, or bloody spit. ?Get help right away if: ?You have a stiff neck. ?You drool or cannot swallow liquids. ?You cannot drink or take medicines without vomiting. ?You have very bad pain that does not go away with medicine. ?You have problems breathing, and it is not from a stuffy nose. ?You have new pain and swelling in your knees, ankles, wrists, or elbows. ?These symptoms may be an emergency. Get help right away. Call your local emergency services (911 in the [place] ?Do not wait to see if the symptoms will go away. ?Do not drive yourself to the hospital. ?Summary ?Pharyngitis is a sore throat (pharynx). This is   when there is redness, pain, and swelling in your throat. ?Most of the time, pharyngitis gets better on its own. Sometimes, you may need medicine. ?If you were prescribed an antibiotic medicine, take it as told by your doctor. Do not stop taking the antibiotic even if you start to feel better. ?This information is not intended to  replace advice given to you by your health care provider. Make sure you discuss any questions you have with your health care provider. ?Document Revised: 06/22/2020 Document Reviewed: 06/22/2020 ?Elsevier Patient Education ? 2022 [employer] ? ?

## 2021-01-28 NOTE — Progress Notes (Signed)
Virtual Visit Consent   Elizabeth Galvan, you are scheduled for a virtual visit with Elizabeth Galvan, [place], a [facility] provider, today.     Just as with appointments in the office, your consent must be obtained to participate.  Your consent will be active for this visit and any virtual visit you may have with one of our providers in the next 365 days.     If you have a MyChart account, a copy of this consent can be sent to you electronically.  All virtual visits are billed to your insurance company just like a traditional visit in the office.    As this is a virtual visit, video technology does not allow for your provider to perform a traditional examination.  This may limit your provider's ability to fully assess your condition.  If your provider identifies any concerns that need to be evaluated in person or the need to arrange testing (such as labs, EKG, etc.), we will make arrangements to do so.     Although advances in technology are sophisticated, we cannot ensure that it will always work on either your end or our end.  If the connection with a video visit is poor, the visit may have to be switched to a telephone visit.  With either a video or telephone visit, we are not always able to ensure that we have a secure connection.     I need to obtain your verbal consent now.   Are you willing to proceed with your visit today? YES   Elizabeth Galvan has provided verbal consent on 01/28/2021 for a virtual visit (video or telephone).   Elizabeth Hassell Done, FNP   Date: 01/28/2021 11:02 AM   Virtual Visit via Video Note   I, Elizabeth Galvan, connected with Elizabeth Galvan ([id number], 1980/11/13) on 01/28/21 at 11:15 AM EDT by a video-enabled telemedicine application and verified that I am speaking with the correct person using two identifiers.  Location: Patient: Virtual Visit Location Patient: Home Provider: Virtual Visit Location Provider: Mobile   I discussed the limitations of  evaluation and management by telemedicine and the availability of in person appointments. The patient expressed understanding and agreed to proceed.    History of Present Illness: Elizabeth Galvan is a 40 y.o. who identifies as a female who was assigned female at birth, and is being seen today for red eye.  HPI: Patient says that her right eye is red and irritated and was slightly matted together this morning. She also developed a sore throat yesterday. She is abel to eat and drinking.    Review of Systems  Constitutional:  Negative for chills and fever.  HENT:  Positive for sore throat. Negative for congestion.   Eyes:  Positive for pain (slight on right) and discharge (right- clear). Negative for blurred vision, double vision, photophobia and redness.  Respiratory:  Negative for cough.   Musculoskeletal:  Negative for myalgias.  Neurological:  Negative for headaches.  All other systems reviewed and are negative.  Problems:  Patient Active Problem List   Diagnosis Date Noted   Prediabetes 01/11/2021   Hyperlipidemia 01/11/2021   Generalized body aches 12/19/2020   Normal labor and delivery 08/26/2020    Allergies: No Known Allergies Medications:  Current Outpatient Medications:    acetaminophen (TYLENOL 8 HOUR) 650 MG CR tablet, Take 1 tablet (650 mg total) by mouth every 8 (eight) hours as needed for pain., Disp: 60 tablet, Rfl: 3   atorvastatin (LIPITOR) 20 MG tablet, Take  1 tablet (20 mg total) by mouth daily., Disp: 120 tablet, Rfl: 0   azelastine (OPTIVAR) 0.05 % ophthalmic solution, Place 1 drop into both eyes 2 (two) times daily., Disp: 6 mL, Rfl: 0   calcium-vitamin D (OSCAL WITH D) 500-200 MG-UNIT tablet, Take 1 tablet by mouth., Disp: , Rfl:    cyclobenzaprine (FLEXERIL) 10 MG tablet, Take 1 tablet (10 mg total) by mouth 2 (two) times daily as needed for muscle spasms., Disp: 20 tablet, Rfl: 0   ferrous sulfate 324 MG TBEC, Take 324 mg by mouth., Disp: , Rfl:    fluticasone  (FLONASE) 50 MCG/ACT nasal spray, Place 2 sprays into both nostrils daily., Disp: 16 g, Rfl: 0   ibuprofen (ADVIL) 600 MG tablet, Take 1 tablet (600 mg total) by mouth every 8 (eight) hours as needed., Disp: 30 tablet, Rfl: 0   loratadine (CLARITIN) 10 MG tablet, Take 1 tablet (10 mg total) by mouth daily., Disp: 30 tablet, Rfl: 4   Prenatal Vit-Fe Fumarate-FA (PRENATAL MULTIVITAMIN) TABS tablet, Take 1 tablet by mouth daily at 12 noon., Disp: , Rfl:    promethazine-dextromethorphan (PROMETHAZINE-DM) 6.25-15 MG/5ML syrup, Take 5 mLs by mouth 4 (four) times daily as needed for cough., Disp: 118 mL, Rfl: 0  Observations/Objective: Patient is well-developed, well-nourished in no acute distress.  Resting comfortably  at home.  Head is normocephalic, atraumatic.  No labored breathing.  Speech is clear and coherent with logical content.  Patient is alert and oriented at baseline.  Right eye mild erythema. No edema   Assessment and Plan:  Elizabeth Galvan in today with chief complaint of No chief complaint on file.   1. Red eye Good handwashing No rubbing eye Cool compresses - trimethoprim-polymyxin b (POLYTRIM) ophthalmic solution; Place 2 drops into both eyes every 4 (four) hours.  Dispense: 10 mL; Refill: 0  2. Viral pharyngitis Force fluids Motrin or tylenol OTC OTC decongestant Throat lozenges if help New toothbrush in 3 days   Patient Instructions  Pharyngitis Pharyngitis is a sore throat (pharynx). This is when there is redness, pain, and swelling in your throat. Most of the time, this condition gets better on its own. In some cases, you may need medicine. What are the causes? An infection from a virus. An infection from bacteria. Allergies. What increases the risk? Being 45-37 years old. Being in crowded environments. These include: Daycares. Schools. Dormitories. Living in a place with cold temperatures outside. Having a weakened disease-fighting (immune) system. What  are the signs or symptoms? Symptoms may vary depending on the cause. Common symptoms include: Sore throat. Tiredness (fatigue). Low-grade fever. Stuffy nose. Cough. Headache. Other symptoms may include: Glands in the neck (lymph nodes) that are swollen. Skin rashes. Film on the throat or tonsils. This can be caused by an infection from bacteria. Vomiting. Red, itchy eyes. Loss of appetite. Joint pain and muscle aches. Tonsils that are temporarily bigger than usual (enlarged). How is this treated? Many times, treatment is not needed. This condition usually gets better in 3-4 days without treatment. If the infection is caused by a bacteria, you may be need to take antibiotics. Follow these instructions at home: Medicines Take over-the-counter and prescription medicines only as told by your doctor. If you were prescribed an antibiotic medicine, take it as told by your doctor. Do not stop taking the antibiotic even if you start to feel better. Use throat lozenges or sprays to soothe your throat as told by your doctor. Children can get pharyngitis. Do  not give your child aspirin. Managing pain To help with pain, try: Sipping warm liquids, such as: Broth. Herbal tea. Warm water. Eating or drinking cold or frozen liquids, such as frozen ice pops. Rinsing your mouth (gargle) with a salt water mixture 3-4 times a day or as needed. To make salt water, dissolve -1 tsp (3-6 g) of salt in 1 cup (237 mL) of warm water. Do not swallow this mixture. Sucking on hard candy or throat lozenges. Putting a cool-mist humidifier in your bedroom at night to moisten the air. Sitting in the bathroom with the door closed for 5-10 minutes while you run hot water in the shower.  General instructions  Do not smoke or use any products that contain nicotine or tobacco. If you need help quitting, ask your doctor. Rest as told by your doctor. Drink enough fluid to keep your pee (urine) pale yellow. How is  this prevented? Wash your hands often for at least 20 seconds with soap and water. If soap and water are not available, use hand sanitizer. Do not touch your eyes, nose, or mouth with unwashed hands. Wash hands after touching these areas. Do not share cups or eating utensils. Avoid close contact with people who are sick. Contact a doctor if: You have large, tender lumps in your neck. You have a rash. You cough up green, yellow-brown, or bloody spit. Get help right away if: You have a stiff neck. You drool or cannot swallow liquids. You cannot drink or take medicines without vomiting. You have very bad pain that does not go away with medicine. You have problems breathing, and it is not from a stuffy nose. You have new pain and swelling in your knees, ankles, wrists, or elbows. These symptoms may be an emergency. Get help right away. Call your local emergency services (911 in the [place] Do not wait to see if the symptoms will go away. Do not drive yourself to the hospital. Summary Pharyngitis is a sore throat (pharynx). This is when there is redness, pain, and swelling in your throat. Most of the time, pharyngitis gets better on its own. Sometimes, you may need medicine. If you were prescribed an antibiotic medicine, take it as told by your doctor. Do not stop taking the antibiotic even if you start to feel better. This information is not intended to replace advice given to you by your health care provider. Make sure you discuss any questions you have with your health care provider. Document Revised: 06/22/2020 Document Reviewed: 06/22/2020 Elsevier Patient Education  [employer] [place].    Follow Up Instructions: I discussed the assessment and treatment plan with the patient. The patient was provided an opportunity to ask questions and all were answered. The patient agreed with the plan and demonstrated an understanding of the instructions.  A copy of instructions were sent to the  patient via MyChart.  The patient was advised to call back or seek an in-person evaluation if the symptoms worsen or if the condition fails to improve as anticipated.  Time:  I spent 11 minutes with the patient via telehealth technology discussing the above problems/concerns.    Elizabeth Hassell Done, FNP

## 2021-02-01 ENCOUNTER — Other Ambulatory Visit: Payer: Self-pay | Admitting: Family

## 2021-02-01 DIAGNOSIS — J3089 Other allergic rhinitis: Secondary | ICD-10-CM

## 2021-02-05 ENCOUNTER — Other Ambulatory Visit: Payer: Self-pay | Admitting: Nurse Practitioner

## 2021-02-05 DIAGNOSIS — J069 Acute upper respiratory infection, unspecified: Secondary | ICD-10-CM

## 2021-02-14 ENCOUNTER — Ambulatory Visit
Admission: RE | Admit: 2021-02-14 | Discharge: 2021-02-14 | Disposition: A | Discharge status: Home / Self Care | Payer: BC Managed Care – PPO | Source: Ambulatory Visit | Attending: Physician Assistant | Admitting: Physician Assistant

## 2021-02-14 ENCOUNTER — Other Ambulatory Visit: Payer: Self-pay

## 2021-02-14 VITALS — BP 127/83 | HR 62 | Temp 98.1°F | Resp 18

## 2021-02-14 DIAGNOSIS — M545 Low back pain, unspecified: Secondary | ICD-10-CM

## 2021-02-14 MED ORDER — NAPROXEN 500 MG PO TABS: 500.0000 mg | ORAL_TABLET | Freq: Two times a day (BID) | ORAL | 0 refills | Status: DC

## 2021-02-14 NOTE — Discharge Instructions (Signed)
Please follow up with PCP if symptoms do not improve.
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
## 2021-02-15 ENCOUNTER — Encounter: Payer: Self-pay | Admitting: Nurse Practitioner

## 2021-02-15 ENCOUNTER — Telehealth (INDEPENDENT_AMBULATORY_CARE_PROVIDER_SITE_OTHER): Payer: BC Managed Care – PPO | Admitting: Nurse Practitioner

## 2021-02-15 ENCOUNTER — Ambulatory Visit
Admission: RE | Admit: 2021-02-15 | Discharge: 2021-02-15 | Disposition: A | Discharge status: Home / Self Care | Payer: BC Managed Care – PPO | Source: Ambulatory Visit | Attending: Nurse Practitioner | Admitting: Nurse Practitioner

## 2021-02-15 DIAGNOSIS — M5442 Lumbago with sciatica, left side: Secondary | ICD-10-CM

## 2021-02-15 NOTE — Patient Instructions (Signed)
Back pain:  Will order xray  May alternate heat and ice  Please continue medications as already prescribed  Will place referral to ortho  If symptoms do not improve - may need PT  Follow up:  Follow up with PCP if needed
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
## 2021-02-27 ENCOUNTER — Encounter: Payer: Self-pay | Admitting: Orthopedic Surgery

## 2021-02-27 ENCOUNTER — Other Ambulatory Visit: Payer: Self-pay

## 2021-02-27 ENCOUNTER — Ambulatory Visit (INDEPENDENT_AMBULATORY_CARE_PROVIDER_SITE_OTHER): Payer: Medicaid Other | Admitting: Orthopedic Surgery

## 2021-02-27 DIAGNOSIS — M4306 Spondylolysis, lumbar region: Secondary | ICD-10-CM | POA: Diagnosis not present
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
## 2021-03-06 ENCOUNTER — Other Ambulatory Visit: Payer: Self-pay

## 2021-03-06 ENCOUNTER — Encounter: Payer: Self-pay | Admitting: Nurse Practitioner

## 2021-03-06 ENCOUNTER — Telehealth (INDEPENDENT_AMBULATORY_CARE_PROVIDER_SITE_OTHER): Payer: BC Managed Care – PPO | Admitting: Nurse Practitioner

## 2021-03-06 DIAGNOSIS — M5442 Lumbago with sciatica, left side: Secondary | ICD-10-CM | POA: Diagnosis not present

## 2021-03-06 MED ORDER — MELOXICAM 7.5 MG PO TABS: 7.5000 mg | ORAL_TABLET | Freq: Every day | ORAL | 0 refills | Status: AC

## 2021-03-06 MED ORDER — CYCLOBENZAPRINE HCL 10 MG PO TABS: 10.0000 mg | ORAL_TABLET | Freq: Two times a day (BID) | ORAL | 0 refills | Status: DC | PRN

## 2021-03-06 NOTE — Patient Instructions (Signed)
Back pain:   May alternate heat and ice   Will refill flexeril  Will trial mobic   Please follow with PT   Follow up:   Follow up with PCP in 2 weeks and follow up with ortho as scheduled
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
## 2021-03-20 ENCOUNTER — Other Ambulatory Visit: Payer: Self-pay

## 2021-03-20 ENCOUNTER — Ambulatory Visit (INDEPENDENT_AMBULATORY_CARE_PROVIDER_SITE_OTHER): Payer: BC Managed Care – PPO | Admitting: Family

## 2021-03-20 ENCOUNTER — Encounter: Payer: Self-pay | Admitting: Family

## 2021-03-20 VITALS — BP 117/75 | HR 68 | Temp 98.3°F | Resp 18 | Ht 64.92 in | Wt 261.4 lb

## 2021-03-20 DIAGNOSIS — D352 Benign neoplasm of pituitary gland: Secondary | ICD-10-CM

## 2021-03-20 DIAGNOSIS — H9202 Otalgia, left ear: Secondary | ICD-10-CM | POA: Diagnosis not present

## 2021-03-20 DIAGNOSIS — M79605 Pain in left leg: Secondary | ICD-10-CM

## 2021-03-20 DIAGNOSIS — M5442 Lumbago with sciatica, left side: Secondary | ICD-10-CM

## 2021-03-20 NOTE — Progress Notes (Signed)
Pt presents for left leg pain and lower back pain, pt states that leg leg stiffens up when she begins to walk after standing for awhile. 

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
## 2021-03-23 ENCOUNTER — Ambulatory Visit: Payer: BC Managed Care – PPO | Attending: Orthopedic Surgery | Admitting: Physical Therapy

## 2021-03-23 ENCOUNTER — Other Ambulatory Visit: Payer: Self-pay

## 2021-03-23 DIAGNOSIS — M545 Low back pain, unspecified: Secondary | ICD-10-CM | POA: Insufficient documentation

## 2021-03-23 DIAGNOSIS — M6289 Other specified disorders of muscle: Secondary | ICD-10-CM | POA: Insufficient documentation

## 2021-03-23 DIAGNOSIS — M6281 Muscle weakness (generalized): Secondary | ICD-10-CM | POA: Diagnosis present

## 2021-03-23 DIAGNOSIS — M4306 Spondylolysis, lumbar region: Secondary | ICD-10-CM | POA: Insufficient documentation

## 2021-03-24 ENCOUNTER — Telehealth (INDEPENDENT_AMBULATORY_CARE_PROVIDER_SITE_OTHER): Payer: BC Managed Care – PPO | Admitting: Nurse Practitioner

## 2021-03-24 ENCOUNTER — Encounter: Payer: Self-pay | Admitting: Nurse Practitioner

## 2021-03-24 DIAGNOSIS — H5789 Other specified disorders of eye and adnexa: Secondary | ICD-10-CM

## 2021-03-24 DIAGNOSIS — H00014 Hordeolum externum left upper eyelid: Secondary | ICD-10-CM | POA: Diagnosis not present

## 2021-03-24 MED ORDER — POLYMYXIN B-TRIMETHOPRIM 10000-0.1 UNIT/ML-% OP SOLN
1.0000 [drp] | Freq: Four times a day (QID) | OPHTHALMIC | 0 refills | Status: DC
Start: 2021-03-24 — End: 2021-07-06

## 2021-03-24 NOTE — Progress Notes (Signed)
Virtual Visit via Telephone Note  I connected with Elizabeth Galvan on 03/24/21 at  9:40 AM EST by telephone and verified that I am speaking with the correct person using two identifiers.  Location: Patient: home Provider: office   I discussed the limitations, risks, security and privacy concerns of performing an evaluation and management service by telephone and the availability of in person appointments. I also discussed with the patient that there may be a patient responsible charge related to this service. The patient expressed understanding and agreed to proceed.   History of Present Illness:  Patient presents today for sick visit through telephone visit.  Patient states that symptoms started yesterday.  She states that she is having a left upper eyelid swelling with a hard knot.  She states that she has had the same issue before in the past and it was a stye.  She would like a refill on her eyedrops to help with this. Denies f/c/s, n/v/d, hemoptysis, PND, chest pain or edema.     Observations/Objective:  Vitals with BMI 03/20/2021 02/14/2021 01/20/2021  Height 5' 4.921" - -  Weight 261 lbs 6 oz - -  BMI 63.8 - -  Systolic 756 433 295  Diastolic 75 83 83  Pulse 68 62 65      Assessment and Plan:  Stye left eye:  Warm wet compresses to left eye 3 times daily  Will refill bacitracin eyedrops  Follow up:  Follow up if needed  Patient Instructions  Stye left eye:  Warm wet compresses to left eye 3 times daily  Will refill bacitracin eyedrops  Follow up:  Follow up if needed  Stye A stye, also known as a hordeolum, is a bump that forms on an eyelid. It may look like a pimple next to the eyelash. A stye can form inside the eyelid (internal stye) or outside the eyelid (external stye). A stye can cause redness, swelling, and pain on the eyelid. Styes are very common. Anyone can get them at any age. They usually occur in just one eye at a time, but you may have more  than one in either eye. What are the causes? A stye is caused by an infection. The infection is almost always caused by bacteria called Staphylococcus aureus. This is a common type of bacteria that lives on the skin. An internal stye may result from an infected oil-producing gland inside the eyelid. An external stye may be caused by an infection at the base of the eyelash (hair follicle). What increases the risk? You are more likely to develop a stye if: You have had a stye before. You have any of these conditions: Red, itchy, inflamed eyelids (blepharitis). A skin condition such as seborrheic dermatitis or rosacea. High fat levels in your blood (lipids). Dry eyes. What are the signs or symptoms? The most common symptom of a stye is eyelid pain. Internal styes are more painful than external styes. Other symptoms may include: Painful swelling of your eyelid. A scratchy feeling in your eye. Tearing and redness of your eye. A pimple-like bump on the edge of the eyelid. Pus draining from the stye. How is this diagnosed? Your health care provider may be able to diagnose a stye just by examining your eye. The health care provider may also check to make sure: You do not have a fever or other signs of a more serious infection. The infection has not spread to other parts of your eye or areas around your eye. How  is this treated? Most styes will clear up in a few days without treatment or with warm compresses applied to the area. You may need to use antibiotic drops or ointment to treat an infection. Sometimes, steroid drops or ointment are used in addition to antibiotics. In some cases, your health care provider may give you a small steroid injection in the eyelid. If your stye does not heal with routine treatment, your health care provider may drain pus from the stye using a thin blade or needle. This may be done if the stye is large, causing a lot of pain, or affecting your vision. Follow these  instructions at home: Take over-the-counter and prescription medicines only as told by your health care provider. This includes eye drops or ointments. If you were prescribed an antibiotic medicine, steroid medicine, or both, apply or use them as told by your health care provider. Do not stop using the medicine even if your condition improves. Apply a warm, wet cloth (warm compress) to your eye for 5-10 minutes, 4 to 6 times a day. Clean the affected eyelid as directed by your health care provider. Do not wear contact lenses or eye makeup until your stye has healed and your health care provider says that it is safe. Do not try to pop or drain the stye. Do not rub your eye. Contact a health care provider if: You have chills or a fever. Your stye does not go away after several days. Your stye affects your vision. Your eyeball becomes swollen, red, or painful. Get help right away if: You have pain when moving your eye around. Summary A stye is a bump that forms on an eyelid. It may look like a pimple next to the eyelash. A stye can form inside the eyelid (internal stye) or outside the eyelid (external stye). A stye can cause redness, swelling, and pain on the eyelid. Your health care provider may be able to diagnose a stye just by examining your eye. Apply a warm, wet cloth (warm compress) to your eye for 5-10 minutes, 4 to 6 times a day. This information is not intended to replace advice given to you by your health care provider. Make sure you discuss any questions you have with your health care provider. Document Revised: 06/01/2020 Document Reviewed: 06/01/2020 Elsevier Patient Education  2022 [employer].     I discussed the assessment and treatment plan with the patient. The patient was provided an opportunity to ask questions and all were answered. The patient agreed with the plan and demonstrated an understanding of the instructions.   The patient was advised to call back or seek an  in-person evaluation if the symptoms worsen or if the condition fails to improve as anticipated.  I provided 23 minutes of non-face-to-face time during this encounter.   Fenton Foy, NP

## 2021-03-24 NOTE — Patient Instructions (Addendum)
Stye left eye:  Warm wet compresses to left eye 3 times daily  Will refill bacitracin eyedrops  Follow up:  Follow up if needed  Stye A stye, also known as a hordeolum, is a bump that forms on an eyelid. It may look like a pimple next to the eyelash. A stye can form inside the eyelid (internal stye) or outside the eyelid (external stye). A stye can cause redness, swelling, and pain on the eyelid. Styes are very common. Anyone can get them at any age. They usually occur in just one eye at a time, but you may have more than one in either eye. What are the causes? A stye is caused by an infection. The infection is almost always caused by bacteria called Staphylococcus aureus. This is a common type of bacteria that lives on the skin. An internal stye may result from an infected oil-producing gland inside the eyelid. An external stye may be caused by an infection at the base of the eyelash (hair follicle). What increases the risk? You are more likely to develop a stye if: You have had a stye before. You have any of these conditions: Red, itchy, inflamed eyelids (blepharitis). A skin condition such as seborrheic dermatitis or rosacea. High fat levels in your blood (lipids). Dry eyes. What are the signs or symptoms? The most common symptom of a stye is eyelid pain. Internal styes are more painful than external styes. Other symptoms may include: Painful swelling of your eyelid. A scratchy feeling in your eye. Tearing and redness of your eye. A pimple-like bump on the edge of the eyelid. Pus draining from the stye. How is this diagnosed? Your health care provider may be able to diagnose a stye just by examining your eye. The health care provider may also check to make sure: You do not have a fever or other signs of a more serious infection. The infection has not spread to other parts of your eye or areas around your eye. How is this treated? Most styes will clear up in a few days without  treatment or with warm compresses applied to the area. You may need to use antibiotic drops or ointment to treat an infection. Sometimes, steroid drops or ointment are used in addition to antibiotics. In some cases, your health care provider may give you a small steroid injection in the eyelid. If your stye does not heal with routine treatment, your health care provider may drain pus from the stye using a thin blade or needle. This may be done if the stye is large, causing a lot of pain, or affecting your vision. Follow these instructions at home: Take over-the-counter and prescription medicines only as told by your health care provider. This includes eye drops or ointments. If you were prescribed an antibiotic medicine, steroid medicine, or both, apply or use them as told by your health care provider. Do not stop using the medicine even if your condition improves. Apply a warm, wet cloth (warm compress) to your eye for 5-10 minutes, 4 to 6 times a day. Clean the affected eyelid as directed by your health care provider. Do not wear contact lenses or eye makeup until your stye has healed and your health care provider says that it is safe. Do not try to pop or drain the stye. Do not rub your eye. Contact a health care provider if: You have chills or a fever. Your stye does not go away after several days. Your stye affects your vision. Your  eyeball becomes swollen, red, or painful. Get help right away if: You have pain when moving your eye around. Summary A stye is a bump that forms on an eyelid. It may look like a pimple next to the eyelash. A stye can form inside the eyelid (internal stye) or outside the eyelid (external stye). A stye can cause redness, swelling, and pain on the eyelid. Your health care provider may be able to diagnose a stye just by examining your eye. Apply a warm, wet cloth (warm compress) to your eye for 5-10 minutes, 4 to 6 times a day. This information is not intended to  replace advice given to you by your health care provider. Make sure you discuss any questions you have with your health care provider. Document Revised: 06/01/2020 Document Reviewed: 06/01/2020 Elsevier Patient Education  [employer].

## 2021-03-25 NOTE — Therapy (Signed)
OUTPATIENT PHYSICAL THERAPY TREATMENT NOTE   Patient Name: Elizabeth Galvan MRN: [id number] DOB:1981/02/15, 40 y.o., female Today's Date: 03/30/2021  PCP: Elizabeth Herter, NP REFERRING PROVIDER: Camillia Herter, NP   PT End of Session - 03/30/21 0831     Visit Number 2    Number of Visits 16    Date for PT Re-Evaluation 05/18/21    Authorization Type BCBS    PT Start Time 0830    PT Stop Time 0914    PT Time Calculation (min) 44 min             Past Medical History:  Diagnosis Date   Headache    migraines   Obesity    Ovarian cyst    Pituitary adenoma ([place])    Past Surgical History:  Procedure Laterality Date   LAPAROSCOPIC GASTRIC SLEEVE RESECTION     OVARIAN CYST REMOVAL     Patient Active Problem List   Diagnosis Date Noted   Prediabetes 01/11/2021   Hyperlipidemia 01/11/2021   Generalized body aches 12/19/2020   Normal labor and delivery 08/26/2020    REFERRING DIAG: Referral diagnosis: Spondylolysis, lumbar region [M43.06]  THERAPY DIAG:  Low back pain, unspecified back pain laterality, unspecified chronicity, unspecified whether sciatica present  Muscle tightness  Muscle weakness  Spondylolysis, lumbar region  PERTINENT HISTORY: NA  PRECAUTIONS:   PRECAUTIONS: None   WEIGHT BEARING RESTRICTIONS No  SUBJECTIVE: Pt reports that she has been completing her HEP, but has had minimal change in her sxs.  Are you having pain? Yes Pain location: L sided LBP NPRS scale: 7/10 Aggravating factors: Lifting boxes at work (up to 50 lbs), standing for long periods (20 min), prolonged movement Relieving factors: gentle movement (1 hour) Pain description:  tight, stiff, sharp Severity: high Irritability: moderate/high Stage: Subacute Stability: getting worse 24 hour pattern: Worse in morning at end of day after activity   OBJECTIVE:   LUMBAR AROM   AROM AROM  03/23/2021  Flexion limited by 25%  Extension WNL - Relieving  Right lateral flexion  limited by 25% L QL tightness  Left lateral flexion WNL  Right rotation limited by 25%  Left rotation limited by 25%   (Blank rows = not tested)   LE ROM:   ROM Right 03/23/2021 Left 03/23/2021  Hip flexion WNL Limited and painful  Hip extension      Hip abduction      Hip adduction      Hip internal rotation      Hip external rotation      Knee flexion      Knee extension      Ankle dorsiflexion      Ankle plantarflexion      Ankle inversion      Ankle eversion       (Blank rows = not tested)   LE MMT:   MMT Right 03/23/2021 Left 03/23/2021  Hip flexion      Knee extension      Knee flexion      Hip abduction 3+ 3+  Hip extension 3+ 3+  Hip external rotation      Hip internal rotation      Hip adduction      Ankle dorsiflexion      Ankle plantarflexion      Ankle inversion      Ankle eversion       (Blank rows = not tested; all scores listed out of a possible 5)  TREATMENT (03/30/2021):  Therapeutic Exercise: - nu-step L5 23m while taking subjective and planning session with patient - LTR - 20x - CRC - HS stretch - L - Supine sciatic nerve glide - 2x10 L - PPT - 10x (some increase in pain) - Bridge - 10'' hold x10 - alternating SLR - 2x10 - alternating clam - 2x10 - GTB  - S/L 10x ea - Hip adduction ball squeeze - 2x10  Manual Therapy: - CPA and UPA lumbar spine with pt in prone G II-III - STM with percussion device, pt in prone  Patient Education: - HEP was updated and reissued to patient; pt educated on HEP, was provided handout, and verbally confirmed understanding of exercises.   HOME EXERCISE PROGRAM: Access Code: [email or web address] URL: [email or web address] Date: 03/30/2021 Prepared by: [name]  Exercises Seated Sciatic Tensioner - 2 x daily - 7 x weekly - 20 reps Supine Bridge - 1 x daily - 7 x weekly - 3 sets - 10 reps Clamshell with Resistance - 1 x daily - 7 x weekly - 3 sets - 10 reps      ASSESSMENT:   Elizabeth Galvan  is progressing well with therapy.  Pt reports no increase in baseline pain following therapy.  Today we concentrated on core strengthening and hip strengthening.  Pt fatigues rapidly with mat exercises.  We will proceed with mat core progression for now.  Pt will continue to benefit from skilled physical therapy to address remaining deficits and achieve listed goals.  Continue per POC.     GOALS: Goals reviewed with patient? Yes   SHORT TERM GOALS:   STG Name Target Date Goal status  1 Elizabeth Galvan will be >75% HEP compliant to improve carryover between sessions and facilitate independent management of condition   Baseline: No HEP 04/13/2021 INITIAL    LONG TERM GOALS:    LTG Name Target Date Goal status  1 Elizabeth Galvan will improve FOTO score from 43 (baseline) to 63 as a proxy for functional improvement 05/18/2021 INITIAL  2 Elizabeth Galvan will improve the following MMTs to >/= 4/5 to show improvement in strength:  bil hip ext and hip abd    Baseline: 3+/5   05/18/2021 INITIAL  3 Elizabeth Galvan will be able to complete work, including lifting up to 45#, not limited by pain   Baseline: pain reaching 10/10 05/18/2021 INITIAL  4 Elizabeth Galvan will report >/= 50% decrease in pain from evaluation    Baseline: 10/10 max pain 05/18/2021 INITIAL    PLAN: PT FREQUENCY: 1-2x/week   PT DURATION: 8 weeks (Ending 05/18/2021)   PLANNED INTERVENTIONS: Therapeutic exercises, Therapeutic activity, Neuro Muscular re-education, Gait training, Patient/Family education, Joint mobilization, Dry Needling, Electrical stimulation, Spinal mobilization and/or manipulation, Moist heat, Taping, Vasopneumatic device, Ionotophoresis 4mg /ml Dexamethasone, and Manual therapy   PLAN FOR NEXT SESSION: progressive core, and hip     Mathis Dad 03/30/2021, 9:14 AM

## 2021-03-30 ENCOUNTER — Ambulatory Visit: Payer: BC Managed Care – PPO | Admitting: Physical Therapy

## 2021-03-30 ENCOUNTER — Other Ambulatory Visit: Payer: Self-pay

## 2021-03-30 ENCOUNTER — Encounter: Payer: Self-pay | Admitting: Physical Therapy

## 2021-03-30 DIAGNOSIS — M4306 Spondylolysis, lumbar region: Secondary | ICD-10-CM

## 2021-03-30 DIAGNOSIS — M6289 Other specified disorders of muscle: Secondary | ICD-10-CM

## 2021-03-30 DIAGNOSIS — M545 Low back pain, unspecified: Secondary | ICD-10-CM | POA: Diagnosis not present

## 2021-03-30 DIAGNOSIS — M6281 Muscle weakness (generalized): Secondary | ICD-10-CM

## 2021-04-11 ENCOUNTER — Ambulatory Visit: Payer: Managed Care, Other (non HMO) | Attending: Orthopedic Surgery | Admitting: Physical Therapy

## 2021-04-11 ENCOUNTER — Encounter: Payer: Self-pay | Admitting: Physical Therapy

## 2021-04-11 ENCOUNTER — Other Ambulatory Visit: Payer: Self-pay

## 2021-04-11 DIAGNOSIS — M545 Low back pain, unspecified: Secondary | ICD-10-CM | POA: Diagnosis not present

## 2021-04-11 DIAGNOSIS — M6289 Other specified disorders of muscle: Secondary | ICD-10-CM | POA: Diagnosis present

## 2021-04-11 DIAGNOSIS — M6281 Muscle weakness (generalized): Secondary | ICD-10-CM | POA: Diagnosis present

## 2021-04-11 DIAGNOSIS — M4306 Spondylolysis, lumbar region: Secondary | ICD-10-CM | POA: Insufficient documentation

## 2021-04-11 NOTE — Therapy (Signed)
OUTPATIENT PHYSICAL THERAPY TREATMENT NOTE   Patient Name: Elizabeth Galvan MRN: [id number] DOB:October 17, 1980, 41 y.o., female Today's Date: 04/11/2021  PCP: Camillia Herter, NP REFERRING PROVIDER: Newt Minion, MD   PT End of Session - 04/11/21 1131     Visit Number 3    Number of Visits 16    Date for PT Re-Evaluation 05/18/21    Authorization Type BCBS    PT Start Time 1130    PT Stop Time 1214    PT Time Calculation (min) 44 min              Past Medical History:  Diagnosis Date   Headache    migraines   Obesity    Ovarian cyst    Pituitary adenoma ([place])    Past Surgical History:  Procedure Laterality Date   LAPAROSCOPIC GASTRIC SLEEVE RESECTION     OVARIAN CYST REMOVAL     Patient Active Problem List   Diagnosis Date Noted   Prediabetes 01/11/2021   Hyperlipidemia 01/11/2021   Generalized body aches 12/19/2020   Normal labor and delivery 08/26/2020    REFERRING DIAG: Referral diagnosis: Spondylolysis, lumbar region [M43.06]  THERAPY DIAG:  Low back pain, unspecified back pain laterality, unspecified chronicity, unspecified whether sciatica present  Muscle tightness  Muscle weakness  Spondylolysis, lumbar region  PERTINENT HISTORY: NA  PRECAUTIONS:   PRECAUTIONS: None   WEIGHT BEARING RESTRICTIONS No  SUBJECTIVE:   Pt reports significant improvement with the exercises.  She has been HEP compliant.  Are you having pain? Yes Pain location: L sided LBP NPRS scale: 6/10 Aggravating factors: Lifting boxes at work (up to 50 lbs), standing for long periods (20 min), prolonged movement Relieving factors: gentle movement (1 hour) Pain description:  tight, stiff, sharp Severity: high Irritability: moderate/high Stage: Subacute Stability: getting worse 24 hour pattern: Worse in morning at end of day after activity   OBJECTIVE:   LUMBAR AROM   AROM AROM  03/23/2021  Flexion limited by 25%  Extension WNL - Relieving  Right lateral flexion  limited by 25% L QL tightness  Left lateral flexion WNL  Right rotation limited by 25%  Left rotation limited by 25%   (Blank rows = not tested)   LE ROM:   ROM Right 03/23/2021 Left 03/23/2021  Hip flexion WNL Limited and painful  Hip extension      Hip abduction      Hip adduction      Hip internal rotation      Hip external rotation      Knee flexion      Knee extension      Ankle dorsiflexion      Ankle plantarflexion      Ankle inversion      Ankle eversion       (Blank rows = not tested)   LE MMT:   MMT Right 03/23/2021 Left 03/23/2021  Hip flexion      Knee extension      Knee flexion      Hip abduction 3+ 3+  Hip extension 3+ 3+  Hip external rotation      Hip internal rotation      Hip adduction      Ankle dorsiflexion      Ankle plantarflexion      Ankle inversion      Ankle eversion       (Blank rows = not tested; all scores listed out of a possible 5)  TREATMENT:   Therapeutic  Exercise: - nu-step L7 19m while taking subjective and planning session with patient - LTR - 20x - CRC - HS stretch - L - L hip flexor stretching  - L - Supine sciatic nerve glide - 2x20 L - PPT - 10x (some increase in pain) - staggered bridge - 10'' hold x5 ea (L harder than R) - alternating SLR - 2x10 - Clam - S/L 2x10 ea - Hip adduction pilates squeeze - 2x10  Add in side plank  Manual Therapy: - CPA and UPA lumbar spine with pt in prone G II-III - STM with percussion device, pt in prone   HOME EXERCISE PROGRAM: Access Code: [email or web address] URL: [email or web address] Date: 03/30/2021 Prepared by: [name]  Exercises Seated Sciatic Tensioner - 2 x daily - 7 x weekly - 20 reps Supine Bridge - 1 x daily - 7 x weekly - 3 sets - 10 reps Clamshell with Resistance - 1 x daily - 7 x weekly - 3 sets - 10 reps      ASSESSMENT:   [place] is progressing well with therapy.  Pt reports no increase in baseline pain following therapy.  Today we  concentrated on core strengthening and hip strengthening.  Pt with hip and core strength deficits L>R which we worked on today.  Able to progress to staggered bridge.  Pt will continue to benefit from skilled physical therapy to address remaining deficits and achieve listed goals.  Continue per POC.     GOALS: Goals reviewed with patient? Yes   SHORT TERM GOALS:   STG Name Target Date Goal status  1 Roxanna will be >75% HEP compliant to improve carryover between sessions and facilitate independent management of condition   Baseline: No HEP 04/13/2021 INITIAL    LONG TERM GOALS:    LTG Name Target Date Goal status  1 Calise will improve FOTO score from 43 (baseline) to 63 as a proxy for functional improvement 05/18/2021 INITIAL  2 Melainie will improve the following MMTs to >/= 4/5 to show improvement in strength:  bil hip ext and hip abd    Baseline: 3+/5   05/18/2021 INITIAL  3 Jaide will be able to complete work, including lifting up to 45#, not limited by pain   Baseline: pain reaching 10/10 05/18/2021 INITIAL  4 [place] will report >/= 50% decrease in pain from evaluation    Baseline: 10/10 max pain 05/18/2021 INITIAL    PLAN: PT FREQUENCY: 1-2x/week   PT DURATION: 8 weeks (Ending 05/18/2021)   PLANNED INTERVENTIONS: Therapeutic exercises, Therapeutic activity, Neuro Muscular re-education, Gait training, Patient/Family education, Joint mobilization, Dry Needling, Electrical stimulation, Spinal mobilization and/or manipulation, Moist heat, Taping, Vasopneumatic device, Ionotophoresis 4mg /ml Dexamethasone, and Manual therapy   PLAN FOR NEXT SESSION: progressive core, and hip     Mathis Dad 04/11/2021, 12:11 PM

## 2021-04-15 ENCOUNTER — Ambulatory Visit: Payer: Managed Care, Other (non HMO) | Admitting: Physical Therapy

## 2021-04-15 ENCOUNTER — Encounter: Payer: Self-pay | Admitting: Physical Therapy

## 2021-04-15 ENCOUNTER — Other Ambulatory Visit: Payer: Self-pay

## 2021-04-15 DIAGNOSIS — M545 Low back pain, unspecified: Secondary | ICD-10-CM | POA: Diagnosis not present

## 2021-04-15 DIAGNOSIS — M6289 Other specified disorders of muscle: Secondary | ICD-10-CM

## 2021-04-15 DIAGNOSIS — M6281 Muscle weakness (generalized): Secondary | ICD-10-CM

## 2021-04-15 NOTE — Therapy (Signed)
OUTPATIENT PHYSICAL THERAPY TREATMENT NOTE   Patient Name: Elizabeth Galvan MRN: [id number] DOB:December 18, 1980, 41 y.o., female Today's Date: 04/15/2021  PCP: Camillia Herter, NP REFERRING PROVIDER: Newt Minion, MD   PT End of Session - 04/15/21 6606     Visit Number 4    Number of Visits 16    Date for PT Re-Evaluation 05/18/21    Authorization Type BCBS    PT Start Time 0822    PT Stop Time 0900    PT Time Calculation (min) 38 min              Past Medical History:  Diagnosis Date   Headache    migraines   Obesity    Ovarian cyst    Pituitary adenoma ([place])    Past Surgical History:  Procedure Laterality Date   LAPAROSCOPIC GASTRIC SLEEVE RESECTION     OVARIAN CYST REMOVAL     Patient Active Problem List   Diagnosis Date Noted   Prediabetes 01/11/2021   Hyperlipidemia 01/11/2021   Generalized body aches 12/19/2020   Normal labor and delivery 08/26/2020    REFERRING DIAG: Referral diagnosis: Spondylolysis, 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  She has no pain now, but pain can reach 8-9/10  Are you having pain? Yes Pain location: L sided LBP NPRS scale: 9/10 Aggravating factors: Lifting boxes at work (up to 50 lbs), standing for long periods (20 min), prolonged movement Relieving factors: gentle movement (1 hour) Pain description:  tight, stiff, sharp Severity: high Irritability: moderate/high Stage: Subacute Stability: getting worse 24 hour pattern: Worse in morning at end of day after activity   OBJECTIVE:   LUMBAR AROM   AROM AROM  03/23/2021  Flexion limited by 25%  Extension  WNL - Relieving  Right lateral flexion limited by 25% L QL tightness  Left lateral flexion WNL  Right rotation limited by 25%  Left rotation limited by 25%   (Blank rows = not tested)   LE ROM:   ROM Right 03/23/2021 Left 03/23/2021  Hip flexion WNL Limited and painful  Hip extension      Hip abduction      Hip adduction      Hip internal rotation      Hip external rotation      Knee flexion      Knee extension      Ankle dorsiflexion      Ankle plantarflexion      Ankle inversion      Ankle eversion       (Blank rows = not tested)   LE MMT:   MMT Right 03/23/2021 Left 03/23/2021  Hip flexion      Knee extension      Knee flexion      Hip abduction 3+ 3+  Hip extension 3+ 3+  Hip external rotation      Hip internal rotation      Hip adduction      Ankle dorsiflexion      Ankle plantarflexion      Ankle inversion      Ankle eversion       (Blank rows = not tested;  all scores listed out of a possible 5)  TREATMENT:   Therapeutic Exercise: - nu-step L7 79m while taking subjective and planning session with patient - LTR - 20x - CRC - HS stretch - L - L hip flexor stretching  - L - Supine sciatic nerve glide - 2x20 L - PPT - 10x (some increase in pain) (NT) - staggered bridge - 10'' hold x10 ea - Clam - S/L 3x10 ea - blue TB - Hip adduction pilates squeeze - 3x10 - Side plank from knees L S/L - 2x10  Manual Therapy: - CPA and UPA lumbar spine with pt in prone G II-III - STM with percussion device, pt in prone (NT)  Patient Education: - HEP was updated and reissued to patient; pt educated on HEP, was provided handout, and verbally confirmed understanding of exercises.  HOME EXERCISE PROGRAM: Access Code: [email or web address] URL: [email or web address] Date: 04/15/2021 Prepared by: [name]  Exercises Seated Sciatic Tensioner - 2 x daily - 7 x weekly - 20 reps Clamshell with Resistance - 1 x daily - 7 x weekly - 3 sets - 10 reps Staggered  Bridge - 1 x daily - 7 x weekly - 3 sets - 10 reps Side Plank on Knees - 1 x daily - 7 x weekly - 3 sets - 10 reps       ASSESSMENT:   Elizabeth Galvan is progressing well with therapy.  Pt reports no increase in baseline pain following therapy.  Today we concentrated on core strengthening and hip strengthening.  L HS length remains limited, but is improving.  We will continue mat core progression for now.  Pt will continue to benefit from skilled physical therapy to address remaining deficits and achieve listed goals.  Continue per POC.     GOALS: Goals reviewed with patient? Yes   SHORT TERM GOALS:   STG Name Target Date Goal status  1 Elizabeth Galvan will be >75% HEP compliant to improve carryover between sessions and facilitate independent management of condition   Baseline: No HEP 04/13/2021 INITIAL    LONG TERM GOALS:    LTG Name Target Date Goal status  1 Elizabeth Galvan will improve FOTO score from 43 (baseline) to 63 as a proxy for functional improvement 05/18/2021 INITIAL  2 Elizabeth Galvan will improve the following MMTs to >/= 4/5 to show improvement in strength:  bil hip ext and hip abd    Baseline: 3+/5   05/18/2021 INITIAL  3 Elizabeth Galvan will be able to complete work, including lifting up to 45#, not limited by pain   Baseline: pain reaching 10/10 05/18/2021 INITIAL  4 Elizabeth Galvan will report >/= 50% decrease in pain from evaluation    Baseline: 10/10 max pain 05/18/2021 INITIAL    PLAN: PT FREQUENCY: 1-2x/week   PT DURATION: 8 weeks (Ending 05/18/2021)   PLANNED INTERVENTIONS: Therapeutic exercises, Therapeutic activity, Neuro Muscular re-education, Gait training, Patient/Family education, Joint mobilization, Dry Needling, Electrical stimulation, Spinal mobilization and/or manipulation, Moist heat, Taping, Vasopneumatic device, Ionotophoresis 4mg /ml Dexamethasone, and Manual therapy   PLAN FOR NEXT SESSION: progressive core, and hip     Mathis Dad 04/15/2021, 8:22 AM

## 2021-04-17 NOTE — Progress Notes (Signed)
Patient ID: Vedanshi Massaro, female    DOB: 1980/12/24  MRN: [id number]  CC: Foot Pain   Subjective: Elizabeth Galvan is a 41 y.o. female who presents for foot pain.   Her concerns today include:  Reports right foot pain worse with standing and walking. Pain specially located at the right heel and radiates to the right lower leg. Denies tenderness, redness, warmth, and additional red flag symptoms. Denies recent trauma and/or injury. Reports she works at [employer] which is physically demanding and wearing steel toe boots. 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   Patient Active Problem List   Diagnosis Date Noted   Prediabetes 01/11/2021   Hyperlipidemia 01/11/2021   Generalized body aches 12/19/2020   Normal labor and delivery 08/26/2020     Current Outpatient Medications on File Prior to Visit  Medication Sig Dispense Refill   acetaminophen (TYLENOL 8 HOUR) 650 MG CR tablet Take 1 tablet (650 mg total) by mouth every 8 (eight) hours as needed for pain. 60 tablet 3   atorvastatin (LIPITOR) 20 MG tablet Take 1 tablet (20 mg total) by mouth daily. 120 tablet 0   calcium-vitamin D (OSCAL WITH D) 500-200 MG-UNIT tablet Take 1 tablet by mouth.     cyclobenzaprine (FLEXERIL) 10 MG tablet Take 1 tablet (10 mg total) by mouth 2 (two) times daily as needed for muscle spasms. 20 tablet 0   fluticasone (FLONASE) 50 MCG/ACT nasal spray SPRAY 2 SPRAYS INTO EACH NOSTRIL EVERY DAY 16 mL 0   ibuprofen (ADVIL) 600 MG tablet Take 1 tablet (600 mg total) by mouth every 8 (eight) hours as needed. 30 tablet 0   Iron, Ferrous Sulfate, 325 (65 Fe) MG TABS 1 tablet     loratadine (CLARITIN) 10 MG tablet Take 1 tablet (10 mg total) by mouth daily. 30 tablet 4   meloxicam (MOBIC) 7.5 MG tablet Take 1 tablet (7.5 mg total) by mouth daily. 30 tablet 0   naproxen (NAPROSYN) 500 MG tablet Take 1 tablet (500 mg total) by mouth 2 (two) times daily. 30 tablet 0   trimethoprim-polymyxin b (POLYTRIM) ophthalmic  solution Place 1 drop into the left eye every 6 (six) hours. 10 mL 0   No current facility-administered medications on file prior to visit.    No Known Allergies  Social History   Socioeconomic History   Marital status: Married    Spouse name: Not on file   Number of children: Not on file   Years of education: Not on file   Highest education level: Not on file  Occupational History   Not on file  Tobacco Use   Smoking status: Never   Smokeless tobacco: Never  Vaping Use   Vaping Use: Never used  Substance and Sexual Activity   Alcohol use: Never   Drug use: Never   Sexual activity: Yes    Birth control/protection: None  Other Topics Concern   Not on file  Social History Narrative   Not on file   Social Determinants of Health   Financial Resource Strain: Not on file  Food Insecurity: Not on file  Transportation Needs: Not on file  Physical Activity: Not on file  Stress: Not on file  Social Connections: Not on file  Intimate Partner Violence: Not on file    Family History  Problem Relation Age of Onset   Diabetes Mother    Hypertension Mother    Varicose Veins Mother     Past Surgical History:  Procedure Laterality Date  LAPAROSCOPIC GASTRIC SLEEVE RESECTION     OVARIAN CYST REMOVAL      ROS: Review of Systems Negative except as stated above  PHYSICAL EXAM: BP 129/84 (BP Location: Left Arm, Patient Position: Sitting, Cuff Size: Large)    Pulse 67    Temp 98.5 F (36.9 C)    Resp 18    Ht 5' 4.92" (1.649 m)    Wt 259 lb (117.5 kg)    SpO2 97%    BMI 43.20 kg/m   Physical Exam HENT:     Head: Normocephalic and atraumatic.  Eyes:     Extraocular Movements: Extraocular movements intact.     Conjunctiva/sclera: Conjunctivae normal.     Pupils: Pupils are equal, round, and reactive to light.  Cardiovascular:     Rate and Rhythm: Normal rate and regular rhythm.     Pulses: Normal pulses.     Heart sounds: Normal heart sounds.  Pulmonary:      Effort: Pulmonary effort is normal.     Breath sounds: Normal breath sounds.  Musculoskeletal:     Cervical back: Normal range of motion and neck supple.  Neurological:     General: No focal deficit present.     Mental Status: She is alert and oriented to person, place, and time.  Psychiatric:        Mood and Affect: Mood normal.        Behavior: Behavior normal.    ASSESSMENT AND PLAN: 1. Plantar fasciitis of right foot: - Counseled on conservative measures such as foot exercises and using orthotics when able to do so. Continue with Meloxicam and over-the-counter management.  - As requested provided with work Investment banker, operational. Counseled to schedule appointment for additional paperwork completion that employer may require, patient verbalized understanding.  - Referral to Podiatry for further evaluation and management.  - Ambulatory referral to Podiatry    Patient was given the opportunity to ask questions.  Patient verbalized understanding of the plan and was able to repeat key elements of the plan. Patient was given clear instructions to go to Emergency Department or return to medical center if symptoms don't improve, worsen, or new problems develop.The patient verbalized understanding.   Orders Placed This Encounter  Procedures   Ambulatory referral to Podiatry    Follow-up with primary provider as scheduled.   Camillia Herter, NP

## 2021-04-18 ENCOUNTER — Encounter: Payer: Self-pay | Admitting: Family

## 2021-04-18 ENCOUNTER — Ambulatory Visit: Payer: Managed Care, Other (non HMO) | Admitting: Physical Therapy

## 2021-04-18 ENCOUNTER — Ambulatory Visit (INDEPENDENT_AMBULATORY_CARE_PROVIDER_SITE_OTHER): Payer: Managed Care, Other (non HMO) | Admitting: Family

## 2021-04-18 ENCOUNTER — Other Ambulatory Visit: Payer: Self-pay

## 2021-04-18 VITALS — BP 129/84 | HR 67 | Temp 98.5°F | Resp 18 | Ht 64.92 in | Wt 259.0 lb

## 2021-04-18 DIAGNOSIS — M722 Plantar fascial fibromatosis: Secondary | ICD-10-CM | POA: Diagnosis not present

## 2021-04-18 NOTE — Progress Notes (Signed)
Pt presents for right foot pain

## 2021-04-19 ENCOUNTER — Encounter: Payer: Self-pay | Admitting: Family

## 2021-04-20 NOTE — Progress Notes (Signed)
Virtual Visit via Telephone Note  I connected with Elizabeth Galvan, on 04/21/2021 at 1:46 PM by telephone due to the COVID-19 pandemic and verified that I am speaking with the correct person using two identifiers.  Due to current restrictions/limitations of in-office visits due to the COVID-19 pandemic, this scheduled clinical appointment was converted to a telehealth visit.   Consent: I discussed the limitations, risks, security and privacy concerns of performing an evaluation and management service by telephone and the availability of in person appointments. I also discussed with the patient that there may be a patient responsible charge related to this service. The patient expressed understanding and agreed to proceed.   Location of Patient: Home  Location of Provider: [facility] participating in Telemedicine visit: Gladys Deckard Durene Fruits, NP Elmon Else, CMA   History of Present Illness: Elizabeth Galvan is a 41 year-old female who presents for Covid positive.   Reports took a home Covid test on 04/19/2021 which resulted positive. Possible exposure from child. Symptoms include headache, cough, and nasal congestion. Denies additional red flag symptoms. Tylenol helping for headache.   Past Medical History:  Diagnosis Date   Headache    migraines   Obesity    Ovarian cyst    Pituitary adenoma ([place])    No Known Allergies  Current Outpatient Medications on File Prior to Visit  Medication Sig Dispense Refill   acetaminophen (TYLENOL 8 HOUR) 650 MG CR tablet Take 1 tablet (650 mg total) by mouth every 8 (eight) hours as needed for pain. 60 tablet 3   atorvastatin (LIPITOR) 20 MG tablet Take 1 tablet (20 mg total) by mouth daily. 120 tablet 0   calcium-vitamin D (OSCAL WITH D) 500-200 MG-UNIT tablet Take 1 tablet by mouth.     cyclobenzaprine (FLEXERIL) 10 MG tablet Take 1 tablet (10 mg total) by mouth 2 (two) times daily as needed  for muscle spasms. 20 tablet 0   fluticasone (FLONASE) 50 MCG/ACT nasal spray SPRAY 2 SPRAYS INTO EACH NOSTRIL EVERY DAY 16 mL 0   ibuprofen (ADVIL) 600 MG tablet Take 1 tablet (600 mg total) by mouth every 8 (eight) hours as needed. 30 tablet 0   Iron, Ferrous Sulfate, 325 (65 Fe) MG TABS 1 tablet     loratadine (CLARITIN) 10 MG tablet Take 1 tablet (10 mg total) by mouth daily. 30 tablet 4   meloxicam (MOBIC) 7.5 MG tablet Take 1 tablet (7.5 mg total) by mouth daily. 30 tablet 0   naproxen (NAPROSYN) 500 MG tablet Take 1 tablet (500 mg total) by mouth 2 (two) times daily. 30 tablet 0   trimethoprim-polymyxin b (POLYTRIM) ophthalmic solution Place 1 drop into the left eye every 6 (six) hours. 10 mL 0   No current facility-administered medications on file prior to visit.    Observations/Objective: Alert and oriented x 3. Not in acute distress. Physical examination not completed as this is a telemedicine visit.  Assessment and Plan: 1. COVID: - Fluticasone, Benzonatate, and Molnupiravir as prescribed.  - Continue over-the-counter Acetaminophen and Ibuprofen for headache management.  - Patient was given clear instructions to go to Emergency Department or return to medical center if symptoms don't improve, worsen, or new problems develop.The patient verbalized understanding. - Follow-up with primary provider as scheduled. - fluticasone (FLONASE) 50 MCG/ACT nasal spray; Place 2 sprays into both nostrils daily.  Dispense: 16 g; Refill: 6 - benzonatate (TESSALON) 100 MG capsule; Take 1 capsule (100 mg  total) by mouth 2 (two) times daily as needed for cough.  Dispense: 20 capsule; Refill: 0 - molnupiravir EUA (LAGEVRIO) 200 mg CAPS capsule; Take 4 capsules (800 mg total) by mouth 2 (two) times daily for 5 days.  Dispense: 40 capsule; Refill: 0   Follow Up Instructions: Follow-up with primary provider as scheduled.   Patient was given clear instructions to go to Emergency Department or return to  medical center if symptoms don't improve, worsen, or new problems develop.The patient verbalized understanding.  I discussed the assessment and treatment plan with the patient. The patient was provided an opportunity to ask questions and all were answered. The patient agreed with the plan and demonstrated an understanding of the instructions.   The patient was advised to call back or seek an in-person evaluation if the symptoms worsen or if the condition fails to improve as anticipated.   I provided 5 minutes total of non-face-to-face time during this encounter.   Camillia Herter, NP  [facility] [place], [place] 04/21/2021, 1:46 PM

## 2021-04-21 ENCOUNTER — Other Ambulatory Visit: Payer: Self-pay

## 2021-04-21 ENCOUNTER — Ambulatory Visit (INDEPENDENT_AMBULATORY_CARE_PROVIDER_SITE_OTHER): Payer: Managed Care, Other (non HMO) | Admitting: Family

## 2021-04-21 ENCOUNTER — Encounter: Payer: Self-pay | Admitting: Family

## 2021-04-21 DIAGNOSIS — U071 COVID-19: Secondary | ICD-10-CM | POA: Diagnosis not present

## 2021-04-21 MED ORDER — MOLNUPIRAVIR EUA 200MG CAPSULE: 4.0000 | ORAL_CAPSULE | Freq: Two times a day (BID) | ORAL | 0 refills | Status: AC

## 2021-04-21 MED ORDER — FLUTICASONE PROPIONATE 50 MCG/ACT NA SUSP: 2.0000 | Freq: Every day | NASAL | 6 refills | Status: AC

## 2021-04-21 MED ORDER — BENZONATATE 100 MG PO CAPS: 100.0000 mg | ORAL_CAPSULE | Freq: Two times a day (BID) | ORAL | 0 refills | Status: DC | PRN

## 2021-04-21 NOTE — Progress Notes (Signed)
Pt presents for telemedicine visit for Covid positive on 0/11/23, possible exposure from child, congestion and stuffiness, cough, and headache, upper back pain

## 2021-04-22 ENCOUNTER — Ambulatory Visit: Payer: Managed Care, Other (non HMO) | Admitting: Physical Therapy

## 2021-04-25 ENCOUNTER — Ambulatory Visit (INDEPENDENT_AMBULATORY_CARE_PROVIDER_SITE_OTHER): Payer: Managed Care, Other (non HMO)

## 2021-04-25 ENCOUNTER — Other Ambulatory Visit: Payer: Self-pay

## 2021-04-25 ENCOUNTER — Encounter: Payer: Self-pay | Admitting: Podiatry

## 2021-04-25 ENCOUNTER — Encounter: Payer: Self-pay | Admitting: Physical Therapy

## 2021-04-25 ENCOUNTER — Ambulatory Visit: Payer: Managed Care, Other (non HMO) | Admitting: Physical Therapy

## 2021-04-25 ENCOUNTER — Ambulatory Visit (INDEPENDENT_AMBULATORY_CARE_PROVIDER_SITE_OTHER): Payer: Managed Care, Other (non HMO) | Admitting: Podiatry

## 2021-04-25 DIAGNOSIS — M79671 Pain in right foot: Secondary | ICD-10-CM

## 2021-04-25 DIAGNOSIS — M25571 Pain in right ankle and joints of right foot: Secondary | ICD-10-CM

## 2021-04-25 DIAGNOSIS — M4306 Spondylolysis, lumbar region: Secondary | ICD-10-CM

## 2021-04-25 DIAGNOSIS — M7751 Other enthesopathy of right foot: Secondary | ICD-10-CM

## 2021-04-25 DIAGNOSIS — M6281 Muscle weakness (generalized): Secondary | ICD-10-CM

## 2021-04-25 DIAGNOSIS — M545 Low back pain, unspecified: Secondary | ICD-10-CM

## 2021-04-25 DIAGNOSIS — M722 Plantar fascial fibromatosis: Secondary | ICD-10-CM

## 2021-04-25 DIAGNOSIS — M6289 Other specified disorders of muscle: Secondary | ICD-10-CM

## 2021-04-25 MED ORDER — METHYLPREDNISOLONE 4 MG PO TBPK: ORAL_TABLET | ORAL | 0 refills | Status: DC

## 2021-04-25 NOTE — Therapy (Signed)
OUTPATIENT PHYSICAL THERAPY TREATMENT NOTE   Patient Name: Elizabeth Galvan MRN: [id number] DOB:Oct 11, 1980, 41 y.o., female Today's Date: 04/25/2021  PCP: Camillia Herter, NP REFERRING PROVIDER: Newt Minion, MD   PT End of Session - 04/25/21 [phone]     Visit Number 5    Number of Visits 16    Date for PT Re-Evaluation 05/18/21    Authorization Type BCBS    PT Start Time 0917    PT Stop Time 0959    PT Time Calculation (min) 42 min              Past Medical History:  Diagnosis Date   Headache    migraines   Obesity    Ovarian cyst    Pituitary adenoma ([place])    Past Surgical History:  Procedure Laterality Date   LAPAROSCOPIC GASTRIC SLEEVE RESECTION     OVARIAN CYST REMOVAL     Patient Active Problem List   Diagnosis Date Noted   Prediabetes 01/11/2021   Hyperlipidemia 01/11/2021   Generalized body aches 12/19/2020   Normal labor and delivery 08/26/2020    REFERRING DIAG: Referral diagnosis: Spondylolysis, 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  "I think the exercise are helping".  Are you having pain? Yes Pain location: L sided LBP NPRS scale: 5/10 Aggravating factors: Lifting boxes at work (up to 50 lbs), standing for long periods (20 min), prolonged movement Relieving factors: gentle movement (1 hour) Pain description:  tight, stiff, sharp Severity: high Irritability: moderate/high Stage: Subacute Stability: getting worse 24 hour pattern: Worse in morning at end of day after activity   OBJECTIVE:   LUMBAR AROM   AROM AROM  03/23/2021  Flexion limited by 25%  Extension WNL - Relieving  Right lateral  flexion limited by 25% L QL tightness  Left lateral flexion WNL  Right rotation limited by 25%  Left rotation limited by 25%   (Blank rows = not tested)   LE ROM:   ROM Right 03/23/2021 Left 03/23/2021  Hip flexion WNL Limited and painful  Hip extension      Hip abduction      Hip adduction      Hip internal rotation      Hip external rotation      Knee flexion      Knee extension      Ankle dorsiflexion      Ankle plantarflexion      Ankle inversion      Ankle eversion       (Blank rows = not tested)   LE MMT:   MMT Right 03/23/2021 Left 03/23/2021  Hip flexion      Knee extension      Knee flexion      Hip abduction 3+ 3+  Hip extension 3+ 3+  Hip external rotation      Hip internal rotation      Hip adduction      Ankle dorsiflexion      Ankle plantarflexion      Ankle inversion      Ankle eversion       (Blank rows = not tested; all scores listed out of a possible 5)  TREATMENT:  Therapeutic Exercise: - nu-step L7 78m while taking subjective and planning session with patient - LTR - 20x - CRC - HS stretch - L - SKTC (L) 3x30'' - L hip flexor stretching  - L (NT) - Supine sciatic nerve glide - 2x20 L - PPT - 20x - staggered bridge - 10'' hold x5 ea - S/L hip abduction  - 2x10 ea - Side plank from knees L S/L - 4x 10'' ea - bird dog -   Add in abdominal isometric or bird dog next visit  Manual Therapy: - CPA and UPA lumbar spine with pt in prone G II-III - STM R superior glute max, pt in prine  HOME EXERCISE PROGRAM: Access Code: [email or web address] URL: [email or web address] Date: 04/15/2021 Prepared by: [name]  Exercises Seated Sciatic Tensioner - 2 x daily - 7 x weekly - 20 reps Clamshell with Resistance - 1 x daily - 7 x weekly - 3 sets - 10 reps Staggered Bridge - 1 x daily - 7 x weekly - 3 sets - 10 reps Side Plank on Knees - 1 x daily - 7 x weekly - 3 sets - 10 reps       ASSESSMENT:   Camielle is progressing well  with therapy.  Pt reports mild pain reduction following therapy.  Today we concentrated on core strengthening and hip strengthening.  Pt continues to show strength and endurance progress with core musculature.  She has significant fatigue with hip abduction L>R and must be cued to avoid hip flexion compensation.  Pt will continue to benefit from skilled physical therapy to address remaining deficits and achieve listed goals.  Continue per POC.   GOALS: Goals reviewed with patient? Yes   SHORT TERM GOALS:   STG Name Target Date Goal status  1 Pamelyn will be >75% HEP compliant to improve carryover between sessions and facilitate independent management of condition   Baseline: No HEP 04/13/2021 INITIAL    LONG TERM GOALS:    LTG Name Target Date Goal status  1 Shian will improve FOTO score from 43 (baseline) to 63 as a proxy for functional improvement 05/18/2021 INITIAL  2 Nayelie will improve the following MMTs to >/= 4/5 to show improvement in strength:  bil hip ext and hip abd    Baseline: 3+/5   05/18/2021 INITIAL  3 Kenzly will be able to complete work, including lifting up to 45#, not limited by pain   Baseline: pain reaching 10/10 05/18/2021 INITIAL  4 Kemba will report >/= 50% decrease in pain from evaluation    Baseline: 10/10 max pain 05/18/2021 INITIAL    PLAN: PT FREQUENCY: 1-2x/week   PT DURATION: 8 weeks (Ending 05/18/2021)   PLANNED INTERVENTIONS: Therapeutic exercises, Therapeutic activity, Neuro Muscular re-education, Gait training, Patient/Family education, Joint mobilization, Dry Needling, Electrical stimulation, Spinal mobilization and/or manipulation, Moist heat, Taping, Vasopneumatic device, Ionotophoresis 4mg /ml Dexamethasone, and Manual therapy   PLAN FOR NEXT SESSION: progressive core, and hip     Mathis Dad 04/25/2021, 9:59 AM

## 2021-04-25 NOTE — Patient Instructions (Signed)
For instructions on how to put on your Plantar Fascial Brace, please visit [email or web address]   Plantar Fasciitis (Heel Spur Syndrome) with Rehab The plantar fascia is a fibrous, ligament-like, soft-tissue structure that spans the bottom of the foot. Plantar fasciitis is a condition that causes pain in the foot due to inflammation of the tissue. SYMPTOMS   Pain and tenderness on the underneath side of the foot.  Pain that worsens with standing or walking. CAUSES  Plantar fasciitis is caused by irritation and injury to the plantar fascia on the underneath side of the foot. Common mechanisms of injury include:  Direct trauma to bottom of the foot.  Damage to a small nerve that runs under the foot where the main fascia attaches to the heel bone.  Stress placed on the plantar fascia due to bone spurs. RISK INCREASES WITH:   Activities that place stress on the plantar fascia (running, jumping, pivoting, or cutting).  Poor strength and flexibility.  Improperly fitted shoes.  Tight calf muscles.  Flat feet.  Failure to warm-up properly before activity.  Obesity. PREVENTION  Warm up and stretch properly before activity.  Allow for adequate recovery between workouts.  Maintain physical fitness:  Strength, flexibility, and endurance.  Cardiovascular fitness.  Maintain a health body weight.  Avoid stress on the plantar fascia.  Wear properly fitted shoes, including arch supports for individuals who have flat feet.  PROGNOSIS  If treated properly, then the symptoms of plantar fasciitis usually resolve without surgery. However, occasionally surgery is necessary.  RELATED COMPLICATIONS   Recurrent symptoms that may result in a chronic condition.  Problems of the lower back that are caused by compensating for the injury, such as limping.  Pain or weakness of the foot during push-off following surgery.  Chronic inflammation, scarring, and partial or complete  fascia tear, occurring more often from repeated injections.  TREATMENT  Treatment initially involves the use of ice and medication to help reduce pain and inflammation. The use of strengthening and stretching exercises may help reduce pain with activity, especially stretches of the Achilles tendon. These exercises may be performed at home or with a therapist. Your caregiver may recommend that you use heel cups of arch supports to help reduce stress on the plantar fascia. Occasionally, corticosteroid injections are given to reduce inflammation. If symptoms persist for greater than 6 months despite non-surgical (conservative), then surgery may be recommended.   MEDICATION   If pain medication is necessary, then nonsteroidal anti-inflammatory medications, such as aspirin and ibuprofen, or other minor pain relievers, such as acetaminophen, are often recommended.  Do not take pain medication within 7 days before surgery.  Prescription pain relievers may be given if deemed necessary by your caregiver. Use only as directed and only as much as you need.  Corticosteroid injections may be given by your caregiver. These injections should be reserved for the most serious cases, because they may only be given a certain number of times.  HEAT AND COLD  Cold treatment (icing) relieves pain and reduces inflammation. Cold treatment should be applied for 10 to 15 minutes every 2 to 3 hours for inflammation and pain and immediately after any activity that aggravates your symptoms. Use ice packs or massage the area with a piece of ice (ice massage).  Heat treatment may be used prior to performing the stretching and strengthening activities prescribed by your caregiver, physical therapist, or athletic trainer. Use a heat pack or soak the injury in warm water.  SEEK IMMEDIATE MEDICAL   CARE IF:  Treatment seems to offer no benefit, or the condition worsens.  Any medications produce adverse side effects.   EXERCISES- RANGE OF MOTION (ROM) AND STRETCHING EXERCISES - Plantar Fasciitis (Heel Spur Syndrome) These exercises may help you when beginning to rehabilitate your injury. Your symptoms may resolve with or without further involvement from your physician, physical therapist or athletic trainer. While completing these exercises, remember:   Restoring tissue flexibility helps normal motion to return to the joints. This allows healthier, less painful movement and activity.  An effective stretch should be held for at least 30 seconds.  A stretch should never be painful. You should only feel a gentle lengthening or release in the stretched tissue.  RANGE OF MOTION - Toe Extension, Flexion  Sit with your right / left leg crossed over your opposite knee.  Grasp your toes and gently pull them back toward the top of your foot. You should feel a stretch on the bottom of your toes and/or foot.  Hold this stretch for 10 seconds.  Now, gently pull your toes toward the bottom of your foot. You should feel a stretch on the top of your toes and or foot.  Hold this stretch for 10 seconds. Repeat  times. Complete this stretch 3 times per day.   RANGE OF MOTION - Ankle Dorsiflexion, Active Assisted  Remove shoes and sit on a chair that is preferably not on a carpeted surface.  Place right / left foot under knee. Extend your opposite leg for support.  Keeping your heel down, slide your right / left foot back toward the chair until you feel a stretch at your ankle or calf. If you do not feel a stretch, slide your bottom forward to the edge of the chair, while still keeping your heel down.  Hold this stretch for 10 seconds. Repeat 3 times. Complete this stretch 2 times per day.   STRETCH  Gastroc, Standing  Place hands on wall.  Extend right / left leg, keeping the front knee somewhat bent.  Slightly point your toes inward on your back foot.  Keeping your right / left heel on the floor and your  knee straight, shift your weight toward the wall, not allowing your back to arch.  You should feel a gentle stretch in the right / left calf. Hold this position for 10 seconds. Repeat 3 times. Complete this stretch 2 times per day.  STRETCH  Soleus, Standing  Place hands on wall.  Extend right / left leg, keeping the other knee somewhat bent.  Slightly point your toes inward on your back foot.  Keep your right / left heel on the floor, bend your back knee, and slightly shift your weight over the back leg so that you feel a gentle stretch deep in your back calf.  Hold this position for 10 seconds. Repeat 3 times. Complete this stretch 2 times per day.  STRETCH  Gastrocsoleus, Standing  Note: This exercise can place a lot of stress on your foot and ankle. Please complete this exercise only if specifically instructed by your caregiver.   Place the ball of your right / left foot on a step, keeping your other foot firmly on the same step.  Hold on to the wall or a rail for balance.  Slowly lift your other foot, allowing your body weight to press your heel down over the edge of the step.  You should feel a stretch in your right / left calf.  Hold this   position for 10 seconds.  Repeat this exercise with a slight bend in your right / left knee. Repeat 3 times. Complete this stretch 2 times per day.   STRENGTHENING EXERCISES - Plantar Fasciitis (Heel Spur Syndrome)  These exercises may help you when beginning to rehabilitate your injury. They may resolve your symptoms with or without further involvement from your physician, physical therapist or athletic trainer. While completing these exercises, remember:   Muscles can gain both the endurance and the strength needed for everyday activities through controlled exercises.  Complete these exercises as instructed by your physician, physical therapist or athletic trainer. Progress the resistance and repetitions only as guided.  STRENGTH -  Towel Curls  Sit in a chair positioned on a non-carpeted surface.  Place your foot on a towel, keeping your heel on the floor.  Pull the towel toward your heel by only curling your toes. Keep your heel on the floor. Repeat 3 times. Complete this exercise 2 times per day.  STRENGTH - Ankle Inversion  Secure one end of a rubber exercise band/tubing to a fixed object (table, pole). Loop the other end around your foot just before your toes.  Place your fists between your knees. This will focus your strengthening at your ankle.  Slowly, pull your big toe up and in, making sure the band/tubing is positioned to resist the entire motion.  Hold this position for 10 seconds.  Have your muscles resist the band/tubing as it slowly pulls your foot back to the starting position. Repeat 3 times. Complete this exercises 2 times per day.  Document Released: 03/26/2005 Document Revised: 06/18/2011 Document Reviewed: 07/08/2008 [employer] Patient Information 2014 [employer]. 

## 2021-04-28 ENCOUNTER — Other Ambulatory Visit: Payer: Self-pay | Admitting: Podiatry

## 2021-04-28 ENCOUNTER — Encounter: Payer: Self-pay | Admitting: Podiatry

## 2021-04-28 MED ORDER — MELOXICAM 15 MG PO TABS: 15.0000 mg | ORAL_TABLET | Freq: Every day | ORAL | 0 refills | Status: DC | PRN

## 2021-04-28 NOTE — Telephone Encounter (Signed)
I called the patient. She states she went to work and as the day went on the heel started to hurt more. She states the last 5 hours of her shift it was hurting. She is on the steroids and not helping. We will finish out the tapered dose of prednisone and then start meloxiam which I sent to the pharmacy. She is going to come by the office to get a walking boot as well. She is going to send FMLA paperwork for accommodations at work. If any worsening/changes to let me know.

## 2021-04-29 ENCOUNTER — Encounter: Payer: Self-pay | Admitting: Physical Therapy

## 2021-04-29 ENCOUNTER — Other Ambulatory Visit: Payer: Self-pay

## 2021-04-29 ENCOUNTER — Ambulatory Visit: Payer: Managed Care, Other (non HMO) | Admitting: Physical Therapy

## 2021-04-29 DIAGNOSIS — M545 Low back pain, unspecified: Secondary | ICD-10-CM

## 2021-04-29 NOTE — Progress Notes (Signed)
Subjective:   Patient ID: Elizabeth Galvan, female   DOB: 41 y.o.   MRN: [id number]   HPI 41 year old female presents the office with concerns of heel pain which has been getting worse over the last 2 weeks.  She has pain in the bottom of her heel.  She has some occasional pain that goes into the ankle as well as the fifth toe.  There is a throbbing discomfort.  At max her pain level is 10/10.  She does have to work on hard surfaces which aggravates her symptoms.  She does a lot of standing.  No injury that she reports.   Review of Systems  All other systems reviewed and are negative.  Past Medical History:  Diagnosis Date   Headache    migraines   Obesity    Ovarian cyst    Pituitary adenoma ([place])     Past Surgical History:  Procedure Laterality Date   LAPAROSCOPIC GASTRIC SLEEVE RESECTION     OVARIAN CYST REMOVAL       Current Outpatient Medications:    meloxicam (MOBIC) 15 MG tablet, Take 1 tablet (15 mg total) by mouth daily as needed for pain., Disp: 30 tablet, Rfl: 0   methylPREDNISolone (MEDROL DOSEPAK) 4 MG TBPK tablet, Take as directed, Disp: 21 tablet, Rfl: 0   acetaminophen (TYLENOL 8 HOUR) 650 MG CR tablet, Take 1 tablet (650 mg total) by mouth every 8 (eight) hours as needed for pain., Disp: 60 tablet, Rfl: 3   atorvastatin (LIPITOR) 20 MG tablet, Take 1 tablet (20 mg total) by mouth daily., Disp: 120 tablet, Rfl: 0   benzonatate (TESSALON) 100 MG capsule, Take 1 capsule (100 mg total) by mouth 2 (two) times daily as needed for cough., Disp: 20 capsule, Rfl: 0   calcium-vitamin D (OSCAL WITH D) 500-200 MG-UNIT tablet, Take 1 tablet by mouth., Disp: , Rfl:    cyclobenzaprine (FLEXERIL) 10 MG tablet, Take 1 tablet (10 mg total) by mouth 2 (two) times daily as needed for muscle spasms., Disp: 20 tablet, Rfl: 0   fluticasone (FLONASE) 50 MCG/ACT nasal spray, Place 2 sprays into both nostrils daily., Disp: 16 g, Rfl: 6   ibuprofen (ADVIL) 600 MG tablet, Take 1 tablet (600 mg  total) by mouth every 8 (eight) hours as needed., Disp: 30 tablet, Rfl: 0   Iron, Ferrous Sulfate, 325 (65 Fe) MG TABS, 1 tablet, Disp: , Rfl:    loratadine (CLARITIN) 10 MG tablet, Take 1 tablet (10 mg total) by mouth daily., Disp: 30 tablet, Rfl: 4   meloxicam (MOBIC) 7.5 MG tablet, Take 1 tablet (7.5 mg total) by mouth daily., Disp: 30 tablet, Rfl: 0   naproxen (NAPROSYN) 500 MG tablet, Take 1 tablet (500 mg total) by mouth 2 (two) times daily., Disp: 30 tablet, Rfl: 0   trimethoprim-polymyxin b (POLYTRIM) ophthalmic solution, Place 1 drop into the left eye every 6 (six) hours., Disp: 10 mL, Rfl: 0  No Known Allergies       Objective:  Physical Exam  General: AAO x3, NAD  Dermatological: Skin is warm, dry and supple bilateral. There are no open sores, no preulcerative lesions, no rash or signs of infection present.  Vascular: Dorsalis Pedis artery and Posterior Tibial artery pedal pulses are 2/4 bilateral with immedate capillary fill time. There is no pain with calf compression, swelling, warmth, erythema.   Neruologic: Grossly intact via light touch bilateral.  Negative Tinel sign.  Musculoskeletal: Tenderness to palpation along the plantar medial tubercle of  the calcaneus at the insertion of plantar fascia on the right foot. There is no pain along the course of the plantar fascia within the arch of the foot. Plantar fascia appears to be intact. There is no pain with lateral compression of the calcaneus or pain with vibratory sensation. There is no pain along the course or insertion of the achilles tendon.  No pain on the ankle today.  No other areas of tenderness to bilateral lower extremities.  Muscular strength 5/5 in all groups tested bilateral.  Gait: Unassisted, Nonantalgic.       Assessment:   41 year old female with heel pain, likely plantar fasciitis     Plan:  -Treatment options discussed including all alternatives, risks, and complications -Etiology of symptoms were  discussed -X-rays were obtained and reviewed with the patient.  No definitive evidence of acute fracture.  There is mild sclerosis of the calcaneus but clinically there is no pain with lateral compression or other signs of stress fracture. -Prescribed a Medrol Dosepak.  Plantar fascial brace was dispensed to help aid in pain control as well as continue additional support.  Discussed stretching, icing daily.  She was asked about work restrictions and we can do this for short-term.  She will send [place] the paperwork.  If no improvement let me know.  I discussed immobilization in cam boot today but given her job not able to do this.  Trula Slade DPM

## 2021-04-29 NOTE — Therapy (Signed)
OUTPATIENT PHYSICAL THERAPY TREATMENT NOTE   Patient Name: Elizabeth Galvan MRN: [id number] DOB:29-Mar-1981, 41 y.o., female Today's Date: 04/29/2021  PCP: Camillia Herter, NP REFERRING PROVIDER: Newt Minion, MD   PT End of Session - 04/29/21 1120     Visit Number 6    Number of Visits 16    Date for PT Re-Evaluation 05/18/21    Authorization Type BCBS    PT Start Time 1118    PT Stop Time 1200    PT Time Calculation (min) 42 min              Past Medical History:  Diagnosis Date   Headache    migraines   Obesity    Ovarian cyst    Pituitary adenoma ([place])    Past Surgical History:  Procedure Laterality Date   LAPAROSCOPIC GASTRIC SLEEVE RESECTION     OVARIAN CYST REMOVAL     Patient Active Problem List   Diagnosis Date Noted   Prediabetes 01/11/2021   Hyperlipidemia 01/11/2021   Generalized body aches 12/19/2020   Normal labor and delivery 08/26/2020    REFERRING DIAG: Referral diagnosis: Spondylolysis, 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 Yes Pain location: L sided LBP NPRS scale: 8/10 Aggravating factors: Lifting boxes at work (up to 50 lbs), standing for long periods (20 min), prolonged movement Relieving factors: gentle movement (1 hour) Pain description:  tight, stiff, sharp Severity: high Irritability: moderate/high Stage: Subacute Stability: getting worse 24 hour pattern: Worse in morning at end of day after activity   OBJECTIVE:   LUMBAR AROM   AROM AROM  03/23/2021  Flexion limited by 25%  Extension WNL - Relieving  Right lateral flexion limited by 25% L QL tightness  Left lateral flexion WNL  Right  rotation limited by 25%  Left rotation limited by 25%   (Blank rows = not tested)   LE ROM:   ROM Right 03/23/2021 Left 03/23/2021  Hip flexion WNL Limited and painful  Hip extension      Hip abduction      Hip adduction      Hip internal rotation      Hip external rotation      Knee flexion      Knee extension      Ankle dorsiflexion      Ankle plantarflexion      Ankle inversion      Ankle eversion       (Blank rows = not tested)   LE MMT:   MMT Right 03/23/2021 Left 03/23/2021  Hip flexion      Knee extension      Knee flexion      Hip abduction 3+ 3+  Hip extension 3+ 3+  Hip external rotation      Hip internal rotation      Hip adduction      Ankle dorsiflexion      Ankle plantarflexion      Ankle inversion      Ankle eversion       (Blank rows = not tested; all scores listed out of a possible 5)  TREATMENT:   Therapeutic Exercise: - nu-step L7 63m while taking subjective  and planning session with patient - LTR - 20x - CRC - HS stretch - L - SKTC (L) 3x30''  Manual Therapy: - CPA and UPA lumbar spine with pt in prone G II-III - STM R superior glute max, pt in prine - Gentle lumbar traction with Pball, pt in supine  HOME EXERCISE PROGRAM: Access Code: [email or web address] URL: [email or web address] Date: 04/15/2021 Prepared by: [name]  Exercises Seated Sciatic Tensioner - 2 x daily - 7 x weekly - 20 reps Clamshell with Resistance - 1 x daily - 7 x weekly - 3 sets - 10 reps Staggered Bridge - 1 x daily - 7 x weekly - 3 sets - 10 reps Side Plank on Knees - 1 x daily - 7 x weekly - 3 sets - 10 reps       ASSESSMENT:   Elizabeth Galvan is progressing fair with therapy.  Pt reports mild pain reduction following therapy.  Today we concentrated on pain reduction.  Pt with higher baseline pain today compared to previous visits.  She is in a bit of a tough situation with a job that requires her to stand and lift for 10 hours at a time.  We talked  about her MD possibly writing her an accomodation letter for a less demanding job temporarily; she will inquire.  Pt will continue to benefit from skilled physical therapy to address remaining deficits and achieve listed goals.  Continue per POC.   GOALS: Goals reviewed with patient? Yes   SHORT TERM GOALS:   STG Name Target Date Goal status  1 Elizabeth Galvan will be >75% HEP compliant to improve carryover between sessions and facilitate independent management of condition   Baseline: No HEP 04/13/2021 MET    LONG TERM GOALS:    LTG Name Target Date Goal status  1 Elizabeth Galvan will improve FOTO score from 43 (baseline) to 63 as a proxy for functional improvement 05/18/2021 INITIAL  2 Elizabeth Galvan will improve the following MMTs to >/= 4/5 to show improvement in strength:  bil hip ext and hip abd    Baseline: 3+/5   05/18/2021 INITIAL  3 Elizabeth Galvan will be able to complete work, including lifting up to 45#, not limited by pain   Baseline: pain reaching 10/10 05/18/2021 INITIAL  4 Elizabeth Galvan will report >/= 50% decrease in pain from evaluation    Baseline: 10/10 max pain 05/18/2021 INITIAL    PLAN: PT FREQUENCY: 1-2x/week   PT DURATION: 8 weeks (Ending 05/18/2021)   PLANNED INTERVENTIONS: Therapeutic exercises, Therapeutic activity, Neuro Muscular re-education, Gait training, Patient/Family education, Joint mobilization, Dry Needling, Electrical stimulation, Spinal mobilization and/or manipulation, Moist heat, Taping, Vasopneumatic device, Ionotophoresis 4mg /ml Dexamethasone, and Manual therapy   PLAN FOR NEXT SESSION: progressive core, and hip     Mathis Dad 04/29/2021, 12:05 PM

## 2021-05-01 NOTE — Telephone Encounter (Signed)
Elizabeth Galvan- can you please take care of this? Thanks!

## 2021-05-02 ENCOUNTER — Ambulatory Visit: Payer: Managed Care, Other (non HMO) | Admitting: Physical Therapy

## 2021-05-02 ENCOUNTER — Other Ambulatory Visit: Payer: Self-pay

## 2021-05-02 ENCOUNTER — Encounter: Payer: Self-pay | Admitting: Physical Therapy

## 2021-05-02 DIAGNOSIS — M6281 Muscle weakness (generalized): Secondary | ICD-10-CM

## 2021-05-02 DIAGNOSIS — M545 Low back pain, unspecified: Secondary | ICD-10-CM | POA: Diagnosis not present

## 2021-05-02 DIAGNOSIS — M4306 Spondylolysis, lumbar region: Secondary | ICD-10-CM

## 2021-05-02 DIAGNOSIS — M6289 Other specified disorders of muscle: Secondary | ICD-10-CM

## 2021-05-02 NOTE — Therapy (Signed)
OUTPATIENT PHYSICAL THERAPY TREATMENT NOTE   Patient Name: Elizabeth Galvan MRN: [id number] DOB:16-Nov-1980, 41 y.o., female Today's Date: 05/02/2021  PCP: Elizabeth Herter, NP REFERRING PROVIDER: Camillia Herter, NP   PT End of Session - 05/02/21 1003     Visit Number 7    Number of Visits 16    Date for PT Re-Evaluation 05/18/21    Authorization Type BCBS    PT Start Time 1002    PT Stop Time 5400    PT Time Calculation (min) 42 min              Past Medical History:  Diagnosis Date   Headache    migraines   Obesity    Ovarian cyst    Pituitary adenoma ([place])    Past Surgical History:  Procedure Laterality Date   LAPAROSCOPIC GASTRIC SLEEVE RESECTION     OVARIAN CYST REMOVAL     Patient Active Problem List   Diagnosis Date Noted   Prediabetes 01/11/2021   Hyperlipidemia 01/11/2021   Generalized body aches 12/19/2020   Normal labor and delivery 08/26/2020    REFERRING DIAG: Referral diagnosis: Spondylolysis, lumbar region [M43.06]  THERAPY DIAG:  Low back pain, unspecified back pain laterality, unspecified chronicity, unspecified whether sciatica present  Muscle tightness  Muscle weakness  Spondylolysis, lumbar region  PERTINENT HISTORY: NA  PRECAUTIONS:   PRECAUTIONS: None   WEIGHT BEARING RESTRICTIONS No  SUBJECTIVE:   Pt reports that he back pain is better than it was last session.  She feels like the exercises are going well at home and are helpful.  Are you having pain? Yes Pain location: L sided LBP NPRS scale: 5/10 Aggravating factors: Lifting boxes at work (up to 50 lbs), standing for long periods (20 min), prolonged movement Relieving factors: gentle movement (1 hour) Pain description:  tight, stiff, sharp Severity: high Irritability: moderate/high Stage: Subacute Stability: getting worse 24 hour pattern: Worse in morning at end of day after activity   OBJECTIVE:   LUMBAR AROM   AROM AROM  03/23/2021  Flexion limited by 25%   Extension WNL - Relieving  Right lateral flexion limited by 25% L QL tightness  Left lateral flexion WNL  Right rotation limited by 25%  Left rotation limited by 25%   (Blank rows = not tested)   LE ROM:   ROM Right 03/23/2021 Left 03/23/2021  Hip flexion WNL Limited and painful  Hip extension      Hip abduction      Hip adduction      Hip internal rotation      Hip external rotation      Knee flexion      Knee extension      Ankle dorsiflexion      Ankle plantarflexion      Ankle inversion      Ankle eversion       (Blank rows = not tested)   LE MMT:   MMT Right 03/23/2021 Left 03/23/2021  Hip flexion      Knee extension      Knee flexion      Hip abduction 3+ 3+  Hip extension 3+ 3+  Hip external rotation      Hip internal rotation      Hip adduction      Ankle dorsiflexion      Ankle plantarflexion      Ankle inversion      Ankle eversion       (Blank rows = not  tested; all scores listed out of a possible 5)  TREATMENT:   Therapeutic Exercise: - nu-step L7 65m while taking subjective and planning session with patient - LTR - 20x - CRC - HS stretch - L (NT) - SKTC (L) 3x30'' (NT) - L hip flexor stretching  - L (NT) - Supine sciatic nerve glide - 2x20 L - PPT - 20x  - with foam roller and belt 3x10 - staggered bridge - 10'' hold 2x4 ea (mov to L only - S/L hip abduction  - 2x10 ea (L) - Side plank from knees L S/L - 2x5 5'' - abdominal ball squeeze - 5'' 2x5 - bird dog - 2x10 - standing shoulder ext - pilates board - 3x10   Patient Education: - HEP was updated and reissued to patient; pt educated on HEP, was provided handout, and verbally confirmed understanding of exercises.  HOME EXERCISE PROGRAM: Access Code: [email or web address] URL: [email or web address] Date: 05/02/2021 Prepared by: Elizabeth Galvan  Exercises Seated Sciatic Tensioner - 2 x daily - 7 x weekly - 20 reps Clamshell with Resistance - 1 x daily - 7 x weekly - 3 sets - 10  reps Staggered Bridge - 1 x daily - 7 x weekly - 3 sets - 10 reps Side Plank on Knees - 1 x daily - 7 x weekly - 3 sets - 10 reps Bird Dog - 1 x daily - 7 x weekly - 2 sets - 10 reps - 5 hold      ASSESSMENT:   Elizabeth Galvan is progressing well with therapy.  Pt reports no increase in baseline pain following therapy.  Today we concentrated on core strengthening and hip strengthening.  [name]  We concentrated on the L hip today and for the sake of time will continue this moving forward.  Significant instability with bird dog showing lack of strength and/or motor control which should be addressed.  Pt will continue to benefit from skilled physical therapy to address remaining deficits and achieve listed goals.  Continue per POC.   GOALS: Goals reviewed with patient? Yes   SHORT TERM GOALS:   STG Name Target Date Goal status  1 Elizabeth Galvan will be >75% HEP compliant to improve carryover between sessions and facilitate independent management of condition   Baseline: No HEP  1/5 (MET) 04/13/2021 MET    LONG TERM GOALS:    LTG Name Target Date Goal status  1 Elizabeth Galvan will improve FOTO score from 43 (baseline) to 63 as a proxy for functional improvement 05/18/2021 INITIAL  2 Elizabeth Galvan will improve the following MMTs to >/= 4/5 to show improvement in strength:  bil hip ext and hip abd    Baseline: 3+/5   05/18/2021 INITIAL  3 Elizabeth Galvan will be able to complete work, including lifting up to 45#, not limited by pain   Baseline: pain reaching 10/10 05/18/2021 INITIAL  4 Elizabeth Galvan will report >/= 50% decrease in pain from evaluation    Baseline: 10/10 max pain 05/18/2021 INITIAL    PLAN: PT FREQUENCY: 1-2x/week   PT DURATION: 8 weeks (Ending 05/18/2021)   PLANNED INTERVENTIONS: Therapeutic exercises, Therapeutic activity, Neuro Muscular re-education, Gait training, Patient/Family education, Joint mobilization, Dry Needling, Electrical stimulation, Spinal  mobilization and/or manipulation, Moist heat, Taping, Vasopneumatic device, Ionotophoresis 4mg /ml Dexamethasone, and Manual therapy   PLAN FOR NEXT SESSION: progressive core, and hip     Elizabeth Galvan 05/02/2021, 10:45 AM

## 2021-05-03 DIAGNOSIS — M79676 Pain in unspecified toe(s): Secondary | ICD-10-CM

## 2021-05-06 ENCOUNTER — Encounter: Payer: Self-pay | Admitting: Physical Therapy

## 2021-05-06 ENCOUNTER — Other Ambulatory Visit: Payer: Self-pay

## 2021-05-06 ENCOUNTER — Ambulatory Visit: Payer: Managed Care, Other (non HMO) | Admitting: Physical Therapy

## 2021-05-06 DIAGNOSIS — M4306 Spondylolysis, lumbar region: Secondary | ICD-10-CM

## 2021-05-06 DIAGNOSIS — M6289 Other specified disorders of muscle: Secondary | ICD-10-CM

## 2021-05-06 DIAGNOSIS — M545 Low back pain, unspecified: Secondary | ICD-10-CM | POA: Diagnosis not present

## 2021-05-06 DIAGNOSIS — M6281 Muscle weakness (generalized): Secondary | ICD-10-CM

## 2021-05-06 NOTE — Therapy (Signed)
OUTPATIENT PHYSICAL THERAPY TREATMENT NOTE   Patient Name: Mckenlee Mangham MRN: [id number] DOB:[birth year], 41 y.o., female Today's Date: 05/06/2021  PCP: Camillia Herter, NP REFERRING PROVIDER: No ref. provider found   PT End of Session - 05/06/21 0814     Visit Number 8    Number of Visits 16    Date for PT Re-Evaluation 05/18/21    Authorization Type BCBS    PT Start Time 0815    PT Stop Time 0858    PT Time Calculation (min) 43 min              Past Medical History:  Diagnosis Date   Headache    migraines   Obesity    Ovarian cyst    Pituitary adenoma ([place])    Past Surgical History:  Procedure Laterality Date   LAPAROSCOPIC GASTRIC SLEEVE RESECTION     OVARIAN CYST REMOVAL     Patient Active Problem List   Diagnosis Date Noted   Prediabetes 01/11/2021   Hyperlipidemia 01/11/2021   Generalized body aches 12/19/2020   Normal labor and delivery 08/26/2020    REFERRING DIAG: Referral diagnosis: Spondylolysis, lumbar region [M43.06]  THERAPY DIAG:  Low back pain, unspecified back pain laterality, unspecified chronicity, unspecified whether sciatica present  Muscle tightness  Muscle weakness  Spondylolysis, lumbar region  PERTINENT HISTORY: NA  PRECAUTIONS:   PRECAUTIONS: None   WEIGHT BEARING RESTRICTIONS No  SUBJECTIVE:   Pt reports continued L LE stiffness.  She feels that the exercises have been helping.  Are you having pain? Yes Pain location: L sided LBP NPRS scale: 7/10 Aggravating factors: Lifting boxes at work (up to 50 lbs), standing for long periods (20 min), prolonged movement Relieving factors: gentle movement (1 hour) Pain description:  tight, stiff, sharp Severity: high Irritability: moderate/high Stage: Subacute Stability: getting worse 24 hour pattern: Worse in morning at end of day after activity   OBJECTIVE:   LUMBAR AROM   AROM AROM  03/23/2021  Flexion limited by 25%  Extension WNL - Relieving  Right lateral  flexion limited by 25% L QL tightness  Left lateral flexion WNL  Right rotation limited by 25%  Left rotation limited by 25%   (Blank rows = not tested)   LE ROM:   ROM Right 03/23/2021 Left 03/23/2021  Hip flexion WNL Limited and painful  Hip extension      Hip abduction      Hip adduction      Hip internal rotation      Hip external rotation      Knee flexion      Knee extension      Ankle dorsiflexion      Ankle plantarflexion      Ankle inversion      Ankle eversion       (Blank rows = not tested)   LE MMT:   MMT Right 03/23/2021 Left 03/23/2021  Hip flexion      Knee extension      Knee flexion      Hip abduction 3+ 3+  Hip extension 3+ 3+  Hip external rotation      Hip internal rotation      Hip adduction      Ankle dorsiflexion      Ankle plantarflexion      Ankle inversion      Ankle eversion       (Blank rows = not tested; all scores listed out of a possible 5)  TREATMENT:  Therapeutic Exercise: - nu-step L7 51mwhile taking subjective and planning session with patient - LTR - 20x - Alternating SLR from foam roller - 3x10 - Supine sciatic nerve glide - 2x20 L - PPT - 20x - staggered bridge - 10'' hold 2x4 ea (move to L only) - S/L hip abduction + ext  - 2x10 ea (L) - Side plank from knees L S/L - 2x5 10'' - abdominal ball squeeze - 5'' 2x10 - bird dog - 10x 10'' - standing shoulder ext - pilates board - 3x10   HOME EXERCISE PROGRAM: Access Code: [email or web address]: [email or web address] Date: 05/02/2021 Prepared by: KShearon Balo Exercises Seated Sciatic Tensioner - 2 x daily - 7 x weekly - 20 reps Clamshell with Resistance - 1 x daily - 7 x weekly - 3 sets - 10 reps Staggered Bridge - 1 x daily - 7 x weekly - 3 sets - 10 reps Side Plank on Knees - 1 x daily - 7 x weekly - 3 sets - 10 reps Bird Dog - 1 x daily - 7 x weekly - 2 sets - 10 reps - 5 hold      ASSESSMENT:   BReyneis progressing fair with therapy.  Pt  reports no increase in baseline pain following therapy.  Today we concentrated on core strengthening and hip strengthening.  Pt will be moving to light duty at work which I think will be helpful (she is currently doing 10 hrs of work x4 which aggs her back).  She is making progress with strength exercises but her L glute area is still an issue, particularly at work.  Moved toward more endurance today.  Significant weakness L hip abductors.  Pt will continue to benefit from skilled physical therapy to address remaining deficits and achieve listed goals.  Continue per POC.   GOALS: Goals reviewed with patient? Yes   SHORT TERM GOALS:   STG Name Target Date Goal status  1 BJhoanawill be >75% HEP compliant to improve carryover between sessions and facilitate independent management of condition   Baseline: No HEP  1/5 (MET) 04/13/2021 MET    LONG TERM GOALS:    LTG Name Target Date Goal status  1 BCriseldawill improve FOTO score from 43 (baseline) to 63 as a proxy for functional improvement 05/18/2021 INITIAL  2 BTanishkawill improve the following MMTs to >/= 4/5 to show improvement in strength:  bil hip ext and hip abd    Baseline: 3+/5   05/18/2021 INITIAL  3 BShefaliwill be able to complete work, including lifting up to 45#, not limited by pain   Baseline: pain reaching 10/10 05/18/2021 INITIAL  4 BHuntleighwill report >/= 50% decrease in pain from evaluation    Baseline: 10/10 max pain 05/18/2021 INITIAL    PLAN: PT FREQUENCY: 1-2x/week   PT DURATION: 8 weeks (Ending 05/18/2021)   PLANNED INTERVENTIONS: Therapeutic exercises, Therapeutic activity, Neuro Muscular re-education, Gait training, Patient/Family education, Joint mobilization, Dry Needling, Electrical stimulation, Spinal mobilization and/or manipulation, Moist heat, Taping, Vasopneumatic device, Ionotophoresis 465mml Dexamethasone, and Manual therapy   PLAN FOR NEXT SESSION: progressive core, and hip     KaMathis Dad/28/2023,  8:15 AM

## 2021-05-09 ENCOUNTER — Encounter: Payer: Self-pay | Admitting: Physical Therapy

## 2021-05-09 ENCOUNTER — Ambulatory Visit: Payer: Managed Care, Other (non HMO) | Admitting: Physical Therapy

## 2021-05-09 ENCOUNTER — Other Ambulatory Visit: Payer: Self-pay

## 2021-05-09 DIAGNOSIS — M6289 Other specified disorders of muscle: Secondary | ICD-10-CM

## 2021-05-09 DIAGNOSIS — M545 Low back pain, unspecified: Secondary | ICD-10-CM

## 2021-05-09 DIAGNOSIS — M6281 Muscle weakness (generalized): Secondary | ICD-10-CM

## 2021-05-09 NOTE — Therapy (Addendum)
OUTPATIENT PHYSICAL THERAPY TREATMENT NOTE   Patient Name: Janda Cargo MRN: [id number] DOB:November 17, 1980, 41 y.o., female Today's Date: 05/09/2021  PCP: Camillia Herter, NP REFERRING PROVIDER: Newt Minion, MD   PT End of Session - 05/09/21 0913     Visit Number 9    Number of Visits 16    Date for PT Re-Evaluation 05/18/21    Authorization Type BCBS    PT Start Time 0915    PT Stop Time 0956    PT Time Calculation (min) 41 min              Past Medical History:  Diagnosis Date   Headache    migraines   Obesity    Ovarian cyst    Pituitary adenoma ([place])    Past Surgical History:  Procedure Laterality Date   LAPAROSCOPIC GASTRIC SLEEVE RESECTION     OVARIAN CYST REMOVAL     Patient Active Problem List   Diagnosis Date Noted   Prediabetes 01/11/2021   Hyperlipidemia 01/11/2021   Generalized body aches 12/19/2020   Normal labor and delivery 08/26/2020    REFERRING DIAG: Referral diagnosis: Spondylolysis, lumbar region [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  Are you having pain? Yes Pain location: L sided LBP NPRS scale: 7/10 Aggravating factors: Lifting boxes at work (up to 50 lbs), standing for long periods (20 min), prolonged movement Relieving factors: gentle movement (1 hour) Pain description:  tight, stiff, sharp Severity: high Irritability: moderate/high Stage: Subacute Stability: getting worse 24 hour pattern: Worse in morning at end of day after activity   OBJECTIVE:   LUMBAR AROM   AROM AROM  03/23/2021  Flexion limited by 25%  Extension WNL - Relieving  Right lateral flexion limited by 25% L QL tightness   Left lateral flexion WNL  Right rotation limited by 25%  Left rotation limited by 25%   (Blank rows = not tested)   LE ROM:   ROM Right 03/23/2021 Left 03/23/2021  Hip flexion WNL Limited and painful  Hip extension      Hip abduction      Hip adduction      Hip internal rotation      Hip external rotation      Knee flexion      Knee extension      Ankle dorsiflexion      Ankle plantarflexion      Ankle inversion      Ankle eversion       (Blank rows = not tested)   LE MMT:   MMT Right 03/23/2021 Left 03/23/2021  Hip flexion      Knee extension      Knee flexion      Hip abduction 3+ 3+  Hip extension 3+ 3+  Hip external rotation      Hip internal rotation      Hip adduction      Ankle dorsiflexion      Ankle plantarflexion      Ankle inversion      Ankle eversion       (Blank rows = not tested; all scores listed out of a possible 5)  TREATMENT:   Therapeutic Exercise: -  nu-step L7 54m while taking subjective and planning session with patient - LTR - 20x - Alternating SLR from foam roller - 3x10 - Supine sciatic nerve glide - 2x20 L - PPT - 20x - S/L bridge - 4x5 - S/L hip abduction + ext  - 2x10 ea (L) - Side plank from knees L S/L w/ clam - 5x5 - abdominal ball squeeze - 5'' 2x10 - bird dog - 10x 10'' - standing shoulder ext - pilates board - 3x10  Manual therapy, concentrating on increasing extensibility of restricted tissue to reduce discomfort and improve mechanics in functional movement: -PA and UPA, pt in prone   HOME EXERCISE PROGRAM: Access Code: [email or web address] URL: [email or web address] Date: 05/02/2021 Prepared by: [name]  Exercises Seated Sciatic Tensioner - 2 x daily - 7 x weekly - 20 reps Clamshell with Resistance - 1 x daily - 7 x weekly - 3 sets - 10 reps Staggered Bridge - 1 x daily - 7 x weekly - 3 sets - 10 reps Side Plank on Knees - 1 x daily - 7 x weekly - 3 sets - 10 reps Bird Dog - 1 x daily - 7 x weekly  - 2 sets - 10 reps - 5 hold      ASSESSMENT:   Marquel is progressing well with therapy.  Pt reports significant pain reduction following therapy (>/= 2 pts NPRS).  Today we concentrated on core strengthening and hip strengthening.  Pt continues to progress hip and core strength.  We returned to PA and UPA with reduction in pain at end of session.  Pt will continue to benefit from skilled physical therapy to address remaining deficits and achieve listed goals.  Continue per POC.   GOALS: Goals reviewed with patient? Yes   SHORT TERM GOALS:   STG Name Target Date Goal status  1 Tanis will be >75% HEP compliant to improve carryover between sessions and facilitate independent management of condition   Baseline: No HEP  1/5 (MET) 04/13/2021 MET    LONG TERM GOALS:    LTG Name Target Date Goal status  1 Yoana will improve FOTO score from 43 (baseline) to 63 as a proxy for functional improvement 05/18/2021 INITIAL  2 Xylia will improve the following MMTs to >/= 4/5 to show improvement in strength:  bil hip ext and hip abd    Baseline: 3+/5   05/18/2021 INITIAL  3 Ommie will be able to complete work, including lifting up to 45#, not limited by pain   Baseline: pain reaching 10/10 05/18/2021 INITIAL  4 Phylis will report >/= 50% decrease in pain from evaluation    Baseline: 10/10 max pain 05/18/2021 INITIAL    PLAN: PT FREQUENCY: 1-2x/week   PT DURATION: 8 weeks (Ending 05/18/2021)   PLANNED INTERVENTIONS: Therapeutic exercises, Therapeutic activity, Neuro Muscular re-education, Gait training, Patient/Family education, Joint mobilization, Dry Needling, Electrical stimulation, Spinal mobilization and/or manipulation, Moist heat, Taping, Vasopneumatic device, Ionotophoresis 4mg /ml Dexamethasone, and Manual therapy   PLAN FOR NEXT SESSION: progressive core, and hip     Kevan Ny Tenita Cue PT 05/09/2021, 9:57 AM

## 2021-05-13 ENCOUNTER — Ambulatory Visit: Payer: Managed Care, Other (non HMO) | Attending: Orthopedic Surgery | Admitting: Physical Therapy

## 2021-05-13 ENCOUNTER — Other Ambulatory Visit: Payer: Self-pay

## 2021-05-13 ENCOUNTER — Encounter: Payer: Self-pay | Admitting: Physical Therapy

## 2021-05-13 DIAGNOSIS — M6289 Other specified disorders of muscle: Secondary | ICD-10-CM | POA: Insufficient documentation

## 2021-05-13 DIAGNOSIS — M6281 Muscle weakness (generalized): Secondary | ICD-10-CM | POA: Insufficient documentation

## 2021-05-13 DIAGNOSIS — M545 Low back pain, unspecified: Secondary | ICD-10-CM | POA: Insufficient documentation

## 2021-05-13 DIAGNOSIS — M4306 Spondylolysis, lumbar region: Secondary | ICD-10-CM | POA: Diagnosis present

## 2021-05-13 NOTE — Therapy (Signed)
OUTPATIENT PHYSICAL THERAPY TREATMENT NOTE   Patient Name: Elizabeth Galvan MRN: [id number] DOB:March 17, 1981, 41 y.o., female Today's Date: 05/13/2021  PCP: Elizabeth Herter, NP REFERRING PROVIDER: Newt Minion, MD   PT End of Session - 05/13/21 [phone]     Visit Number 10    Number of Visits 16    Date for PT Re-Evaluation 05/18/21    Authorization Type BCBS    PT Start Time 0815    PT Stop Time 0858    PT Time Calculation (min) 43 min              Past Medical History:  Diagnosis Date   Headache    migraines   Obesity    Ovarian cyst    Pituitary adenoma ([place])    Past Surgical History:  Procedure Laterality Date   LAPAROSCOPIC GASTRIC SLEEVE RESECTION     OVARIAN CYST REMOVAL     Patient Active Problem List   Diagnosis Date Noted   Prediabetes 01/11/2021   Hyperlipidemia 01/11/2021   Generalized body aches 12/19/2020   Normal labor and delivery 08/26/2020    REFERRING DIAG: Referral diagnosis: Spondylolysis, lumbar region [M43.06]  THERAPY DIAG:  Low back pain, unspecified back pain laterality, unspecified chronicity, unspecified whether sciatica present  Muscle tightness  Muscle weakness  PERTINENT HISTORY: NA  PRECAUTIONS:   PRECAUTIONS: None   WEIGHT BEARING RESTRICTIONS No  SUBJECTIVE:   Pt reports that she is feeling better.  She has been off of work waiting for them to process her light duty accommodations, which has been helpful.  Are you having pain? Yes Pain location: L sided LBP NPRS scale: 5/10 Aggravating factors: Lifting boxes at work (up to 50 lbs), standing for long periods (20 min), prolonged movement Relieving factors: gentle movement (1 hour) Pain description:  tight, stiff, sharp Severity: high Irritability: moderate/high Stage: Subacute Stability: getting worse 24 hour pattern: Worse in morning at end of day after activity   OBJECTIVE:   LUMBAR AROM   AROM AROM  03/23/2021  Flexion limited by 25%  Extension WNL -  Relieving  Right lateral flexion limited by 25% L QL tightness  Left lateral flexion WNL  Right rotation limited by 25%  Left rotation limited by 25%   (Blank rows = not tested)   LE ROM:   ROM Right 03/23/2021 Left 03/23/2021  Hip flexion WNL Limited and painful  Hip extension      Hip abduction      Hip adduction      Hip internal rotation      Hip external rotation      Knee flexion      Knee extension      Ankle dorsiflexion      Ankle plantarflexion      Ankle inversion      Ankle eversion       (Blank rows = not tested)   LE MMT:   MMT Right 03/23/2021 Left 03/23/2021  Hip flexion      Knee extension      Knee flexion      Hip abduction 3+ 3+  Hip extension 3+ 3+  Hip external rotation      Hip internal rotation      Hip adduction      Ankle dorsiflexion      Ankle plantarflexion      Ankle inversion      Ankle eversion       (Blank rows = not tested; all scores listed  out of a possible 5)  TREATMENT:   Therapeutic Exercise: - nu-step L7 27mwhile taking subjective and planning session with patient - LTR - 20x - Supine sciatic nerve glide - 2x20 L - PPT - 20x - S/L bridge - 4x4 5'' (L) - S/L hip abduction + ext  - 2x10 ea (L) - Side plank from knees  S/L w/ clam - 3x10 - dead bug - 10'' 10x - bird dog - 10x 10'' - Paloff press - 7# - 2x10 ea - Seated lumbar ext with bar - 35# - 3x10  Manual therapy, concentrating on increasing extensibility of restricted tissue to reduce discomfort and improve mechanics in functional movement: -PA and UPA, pt in prone   HOME EXERCISE PROGRAM: Access Code: [email or web address]: [email or web address] Date: 05/02/2021 Prepared by: Elizabeth Galvan Exercises Seated Sciatic Tensioner - 2 x daily - 7 x weekly - 20 reps Clamshell with Resistance - 1 x daily - 7 x weekly - 3 sets - 10 reps Staggered Bridge - 1 x daily - 7 x weekly - 3 sets - 10 reps Side Plank on Knees - 1 x daily - 7 x weekly - 3 sets - 10  reps Bird Dog - 1 x daily - 7 x weekly - 2 sets - 10 reps - 5 hold      ASSESSMENT:   BCassandris progressing well with therapy.  Pt reports no increase in baseline pain following therapy.  Today we concentrated on core strengthening and hip strengthening.  We moved to more standing strengthening today to good effect; she tolerated this well.  Pt will continue to benefit from skilled physical therapy to address remaining deficits and achieve listed goals.  Continue per POC.   GOALS: Goals reviewed with patient? Yes   SHORT TERM GOALS:   STG Name Target Date Goal status  1 BJamaiawill be >75% HEP compliant to improve carryover between sessions and facilitate independent management of condition   Baseline: No HEP  1/5 (MET) 04/13/2021 MET    LONG TERM GOALS:    LTG Name Target Date Goal status  1 BGwennwill improve FOTO score from 43 (baseline) to 63 as a proxy for functional improvement 05/18/2021 INITIAL  2 BLekeshiawill improve the following MMTs to >/= 4/5 to show improvement in strength:  bil hip ext and hip abd    Baseline: 3+/5   05/18/2021 INITIAL  3 BGeneviawill be able to complete work, including lifting up to 45#, not limited by pain   Baseline: pain reaching 10/10 05/18/2021 INITIAL  4 BMeoshawill report >/= 50% decrease in pain from evaluation    Baseline: 10/10 max pain 05/18/2021 INITIAL    PLAN: PT FREQUENCY: 1-2x/week   PT DURATION: 8 weeks (Ending 05/18/2021)   PLANNED INTERVENTIONS: Therapeutic exercises, Therapeutic activity, Neuro Muscular re-education, Gait training, Patient/Family education, Joint mobilization, Dry Needling, Electrical stimulation, Spinal mobilization and/or manipulation, Moist heat, Taping, Vasopneumatic device, Ionotophoresis 434mml Dexamethasone, and Manual therapy   PLAN FOR NEXT SESSION: progressive core, and hip     Elizabeth Galvan PT 05/13/2021, 9:04 AM

## 2021-05-15 ENCOUNTER — Encounter: Payer: Self-pay | Admitting: Podiatry

## 2021-05-15 NOTE — Telephone Encounter (Signed)
Elizabeth Galvan- can you please assist her with this? Thanks!

## 2021-05-16 ENCOUNTER — Ambulatory Visit: Payer: Managed Care, Other (non HMO) | Admitting: Physical Therapy

## 2021-05-16 ENCOUNTER — Other Ambulatory Visit: Payer: Self-pay

## 2021-05-16 ENCOUNTER — Ambulatory Visit (INDEPENDENT_AMBULATORY_CARE_PROVIDER_SITE_OTHER): Payer: Managed Care, Other (non HMO) | Admitting: Podiatry

## 2021-05-16 DIAGNOSIS — M7751 Other enthesopathy of right foot: Secondary | ICD-10-CM

## 2021-05-16 DIAGNOSIS — M722 Plantar fascial fibromatosis: Secondary | ICD-10-CM | POA: Diagnosis not present

## 2021-05-16 NOTE — Telephone Encounter (Signed)
Good Morning, I have received paperwork, I will complete the paperwork after visit and after notes have been dictated.

## 2021-05-17 ENCOUNTER — Ambulatory Visit: Payer: Managed Care, Other (non HMO) | Admitting: Physical Therapy

## 2021-05-17 ENCOUNTER — Encounter: Payer: Self-pay | Admitting: Physical Therapy

## 2021-05-17 DIAGNOSIS — M545 Low back pain, unspecified: Secondary | ICD-10-CM

## 2021-05-17 DIAGNOSIS — M4306 Spondylolysis, lumbar region: Secondary | ICD-10-CM

## 2021-05-17 DIAGNOSIS — M6281 Muscle weakness (generalized): Secondary | ICD-10-CM

## 2021-05-17 DIAGNOSIS — M6289 Other specified disorders of muscle: Secondary | ICD-10-CM

## 2021-05-17 NOTE — Therapy (Signed)
PHYSICAL THERAPY DISCHARGE SUMMARY  Visits from Start of Care: 11  Current functional level related to goals / functional outcomes: See assessment/goals   Remaining deficits: See assessment/goals   Education / Equipment: HEP and D/C plans  Patient agrees to discharge. Patient goals were partially met. Patient is being discharged due to being pleased with the current functional level.   Patient Name: Elizabeth Galvan MRN: [id number] DOB:November 23, 1980, 41 y.o., female Today's Date: 05/17/2021  PCP: Camillia Herter, NP REFERRING PROVIDER: Newt Minion, MD   PT End of Session - 05/17/21 1400     Visit Number 11    Number of Visits 16    Date for PT Re-Evaluation 05/18/21    Authorization Type [employer]    PT Start Time 1401    PT Stop Time 1439    PT Time Calculation (min) 38 min              Past Medical History:  Diagnosis Date   Headache    migraines   Obesity    Ovarian cyst    Pituitary adenoma ([place])    Past Surgical History:  Procedure Laterality Date   LAPAROSCOPIC GASTRIC SLEEVE RESECTION     OVARIAN CYST REMOVAL     Patient Active Problem List   Diagnosis Date Noted   Prediabetes 01/11/2021   Hyperlipidemia 01/11/2021   Generalized body aches 12/19/2020   Normal labor and delivery 08/26/2020    REFERRING DIAG: Referral diagnosis: Spondylolysis, lumbar region [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  She feels that PT has been helpfulShe is ready for D/C today.  Are you having pain? Yes Pain location: L sided LBP NPRS scale: 6/10 Aggravating factors: Lifting boxes at work (up to 50 lbs), standing for long periods (20 min), prolonged  movement Relieving factors: gentle movement (1 hour) Pain description:  tight, stiff, sharp Severity: high Irritability: moderate/high Stage: Subacute Stability: getting worse 24 hour pattern: Worse in morning at end of day after activity   OBJECTIVE:   LUMBAR AROM   AROM AROM  03/23/2021  Flexion limited by 25%  Extension WNL - Relieving  Right lateral flexion limited by 25% L QL tightness  Left lateral flexion WNL  Right rotation limited by 25%  Left rotation limited by 25%   (Blank rows = not tested)   LE ROM:   ROM Right 03/23/2021 Left 03/23/2021  Hip flexion WNL Limited and painful  Hip extension      Hip abduction      Hip adduction      Hip internal rotation      Hip external rotation      Knee flexion      Knee extension      Ankle dorsiflexion      Ankle plantarflexion      Ankle inversion      Ankle eversion       (Blank rows = not tested)   LE MMT:   MMT Right 03/23/2021 Left 03/23/2021 R 2/8 L 2/8   Hip flexion        Knee extension        Knee flexion        Hip abduction 3+ 3+  4+ 3+  Hip extension 3+ 3+ 4+ 4+  Hip external rotation        Hip internal rotation        Hip adduction        Ankle dorsiflexion        Ankle plantarflexion        Ankle inversion        Ankle eversion         (Blank rows = not tested; all scores listed out of a possible 5)  TREATMENT:   Therapeutic Exercise: - nu-step L7 19m while taking subjective and planning session with patient - LTR - 20x - Supine sciatic nerve glide - 2x20 L - PPT - 20x - S/L bridge - 4x4 5'' (L) - Side plank from knees  S/L w/ clam - 3x10 - bird dog - 10x 10''  Therapeutic Activity - collecting information for goals, checking progress, and reviewing with patient  Manual therapy, concentrating on increasing extensibility of restricted tissue to reduce discomfort and improve mechanics in functional movement: -PA and UPA, pt in prone   Patient Education: - HEP was updated and  reissued to patient; pt educated on HEP, was provided handout, and verbally confirmed understanding of exercises.  HOME EXERCISE PROGRAM: Access Code: [email or web address] URL: [email or web address] Date: 05/17/2021 Prepared by: [name]  Exercises Seated Sciatic Tensioner - 2 x daily - 7 x weekly - 20 reps Sidelying Hip Abduction - 1 x daily - 7 x weekly - 3 sets - 10 reps Supine Bridge with Knee Extension and Pelvic Floor Contraction - 1 x daily - 7 x weekly - 2 sets - 10 reps - 5'' hold Side Plank with Clam - 1 x daily - 7 x weekly - 3 sets - 10 reps Bird Dog - 1 x daily - 7 x weekly - 2 sets - 10 reps - 5 hold        ASSESSMENT:   Elizabeth Galvan has progressed well with therapy.  Improved impairments include: core and hip strength.  Functional improvements include: improved lifting, reaching, and standing tolerance.  Progressions needed include: work at home with HEP progressing strength and endurance, particularly L hip abduction.  Barriers to progress include: work that aggs low back pain (in process of getting accomodation).  Please see GOALS section for progress on short term and long term goals established at evaluation.  I recommend D/C home with HEP; pt agrees with plan.   GOALS: Goals reviewed with patient? Yes   SHORT TERM GOALS:   STG Name Target Date Goal status  1 Geoffrey will be >75% HEP compliant to improve carryover between sessions and facilitate independent management of condition   Baseline: No HEP  1/5 (MET) 04/13/2021 MET    LONG TERM GOALS:    LTG Name Target Date Goal status  1 Autumn will improve FOTO score from 43 (baseline) to 63 as a proxy for functional improvement  2/8: 67 05/18/2021 MET  2 Aara will improve the following MMTs to >/= 4/5 to show improvement in strength:  bil hip ext and hip abd    Baseline: 3+/5   05/18/2021 Partly met  3 Sher will be able to complete work, including lifting up to 45#, not limited by pain    Baseline: pain reaching 10/10  2/8: increases pain and seeking accommodations 05/18/2021 Partially met  Lee will report >/= 50% decrease in pain from evaluation    Baseline: 10/10 max pain  05/17/21: 6/10 05/18/2021 Partially met  PLAN: PT FREQUENCY: 1-2x/week   PT DURATION: 8 weeks (Ending 05/18/2021)   PLANNED INTERVENTIONS: Therapeutic exercises, Therapeutic activity, Neuro Muscular re-education, Gait training, Patient/Family education, Joint mobilization, Dry Needling, Electrical stimulation, Spinal mobilization and/or manipulation, Moist heat, Taping, Vasopneumatic device, Ionotophoresis 4mg /ml Dexamethasone, and Manual therapy   PLAN FOR NEXT SESSION: progressive core, and hip     Kevan Ny Addis Bennie PT 05/17/2021, 4:41 PM

## 2021-05-19 ENCOUNTER — Encounter: Payer: Self-pay | Admitting: Podiatry

## 2021-05-19 NOTE — Progress Notes (Signed)
Subjective: 41 year old female presents the office today for follow evaluation of right foot pain.  She said when she wears the boot it helps but when she goes without the boot it does still hurt.  No recent injury or changes otherwise since I last saw her.  Occasional swelling.  Objective: AAO x3, NAD DP/PT pulses palpable bilaterally, CRT less than 3 seconds There is tenderness palpation along the right foot on the course, insertion of plantar fascia at the plantar aspect the heel.  There is no pain about the proximal calcaneus.  There is mild discomfort noted along the peroneal tendons.  Flexor, extensor tendons are clinically appear to be intact.  MMT 5/5. No pain with calf compression, swelling, warmth, erythema  Assessment: Right continued heel pain, rule out partial tear of plantar fascia  Plan: -All treatment options discussed with the patient including all alternatives, risks, complications.  -At this point she continues to have pain without the cam boot.  Due to ongoing nature of symptoms will order an MRI.  Discussed other treatment options at this time as well we will await the results of the MRI before proceeding with further treatment.  She agrees with this plan.  Unfortunately she states that she has pain without the boot and work was not able to accommodate her.  We will get the MRI for return to work and hopefully start physical therapy. -Patient encouraged to call the office with any questions, concerns, change in symptoms.   Trula Slade DPM

## 2021-06-03 ENCOUNTER — Other Ambulatory Visit: Payer: Self-pay

## 2021-06-03 ENCOUNTER — Other Ambulatory Visit: Payer: 59

## 2021-06-03 ENCOUNTER — Inpatient Hospital Stay (HOSPITAL_COMMUNITY)
Admission: AD | Admit: 2021-06-03 | Discharge: 2021-06-03 | Disposition: A | Discharge status: Home / Self Care | Payer: Managed Care, Other (non HMO) | Attending: Family Medicine | Admitting: Family Medicine

## 2021-06-03 ENCOUNTER — Ambulatory Visit
Admission: EM | Admit: 2021-06-03 | Discharge: 2021-06-03 | Disposition: A | Discharge status: Home / Self Care | Payer: Managed Care, Other (non HMO) | Attending: Internal Medicine | Admitting: Internal Medicine

## 2021-06-03 DIAGNOSIS — N939 Abnormal uterine and vaginal bleeding, unspecified: Secondary | ICD-10-CM

## 2021-06-03 DIAGNOSIS — E349 Endocrine disorder, unspecified: Secondary | ICD-10-CM | POA: Diagnosis not present

## 2021-06-03 DIAGNOSIS — Z6790 Unspecified blood type, Rh positive: Secondary | ICD-10-CM | POA: Insufficient documentation

## 2021-06-03 LAB — I-STAT VENOUS BLOOD GAS, ED
Acid-Base Excess: 0 mmol/L (ref 0.0–2.0)
Bicarbonate: 20.5 mmol/L (ref 20.0–28.0)
Calcium, Ion: 0.87 mmol/L — CL (ref 1.15–1.40)
HCT: 44 % (ref 36.0–46.0)
Hemoglobin: 15 g/dL (ref 12.0–15.0)
O2 Saturation: 100 %
Potassium: 4.6 mmol/L (ref 3.5–5.1)
Sodium: 136 mmol/L (ref 135–145)
TCO2: 21 mmol/L — ABNORMAL LOW (ref 22–32)
pCO2, Ven: 23.3 mmHg — ABNORMAL LOW (ref 44–60)
pH, Ven: 7.552 — ABNORMAL HIGH (ref 7.25–7.43)
pO2, Ven: 151 mmHg — ABNORMAL HIGH (ref 32–45)

## 2021-06-03 LAB — POCT URINE PREGNANCY: Preg Test, Ur: NEGATIVE

## 2021-06-03 LAB — I-STAT BETA HCG BLOOD, ED (MC, WL, AP ONLY): I-stat hCG, quantitative: 7 m[IU]/mL — ABNORMAL HIGH (ref <5)

## 2021-06-03 LAB — HCG, QUANTITATIVE, PREGNANCY: hCG, Beta Chain, Quant, S: 9 m[IU]/mL — ABNORMAL HIGH (ref <5)

## 2021-06-03 NOTE — ED Provider Notes (Signed)
[facility]    CSN: [id number] Arrival date & time: 06/03/21  1042      History   Chief Complaint Chief Complaint  Patient presents with   vaginal bleeding s/p pregnancy test    HPI Elizabeth Galvan is a 41 y.o. female.   Patient presents with vaginal bleeding after having 2 at home positive pregnancy tests over the past week.  Patient reports that vaginal bleeding started approximately 2 days ago and is mild in nature.  Denies heavy bleeding.  Denies any abdominal cramping or abdominal pain.  Denies any fevers.    Past Medical History:  Diagnosis Date   Headache    migraines   Obesity    Ovarian cyst    Pituitary adenoma [facility])     Patient Active Problem List   Diagnosis Date Noted   Prediabetes 01/11/2021   Hyperlipidemia 01/11/2021   Generalized body aches 12/19/2020   Normal labor and delivery 08/26/2020    Past Surgical History:  Procedure Laterality Date   LAPAROSCOPIC GASTRIC SLEEVE RESECTION     OVARIAN CYST REMOVAL      OB History     Gravida  1   Para  1   Term  1   Preterm      AB      Living  1      SAB      IAB      Ectopic      Multiple  0   Live Births  1            Home Medications    Prior to Admission medications   Medication Sig Start Date End Date Taking? Authorizing Provider  meloxicam (MOBIC) 15 MG tablet Take 1 tablet (15 mg total) by mouth daily as needed for pain. 04/28/21 04/28/22  Trula Slade, DPM  acetaminophen (TYLENOL 8 HOUR) 650 MG CR tablet Take 1 tablet (650 mg total) by mouth every 8 (eight) hours as needed for pain. 08/29/20   Drema Dallas, DO  atorvastatin (LIPITOR) 20 MG tablet Take 1 tablet (20 mg total) by mouth daily. 01/11/21   Camillia Herter, NP  benzonatate (TESSALON) 100 MG capsule Take 1 capsule (100 mg total) by mouth 2 (two) times daily as needed for cough. 04/21/21   Camillia Herter, NP  calcium-vitamin D (OSCAL WITH D) 500-200 MG-UNIT tablet Take 1 tablet by mouth.     [provider]  cyclobenzaprine (FLEXERIL) 10 MG tablet Take 1 tablet (10 mg total) by mouth 2 (two) times daily as needed for muscle spasms. 03/06/21   Fenton Foy, NP  fluticasone (FLONASE) 50 MCG/ACT nasal spray Place 2 sprays into both nostrils daily. 04/21/21   Camillia Herter, NP  ibuprofen (ADVIL) 600 MG tablet Take 1 tablet (600 mg total) by mouth every 8 (eight) hours as needed. 01/10/21   Camillia Herter, NP  Iron, Ferrous Sulfate, 325 (65 Fe) MG TABS 1 tablet    [provider]  loratadine (CLARITIN) 10 MG tablet Take 1 tablet (10 mg total) by mouth daily. 01/10/21   Camillia Herter, NP  meloxicam (MOBIC) 7.5 MG tablet Take 1 tablet (7.5 mg total) by mouth daily. 03/06/21   Fenton Foy, NP  methylPREDNISolone (MEDROL DOSEPAK) 4 MG TBPK tablet Take as directed 04/25/21   Trula Slade, DPM  naproxen (NAPROSYN) 500 MG tablet Take 1 tablet (500 mg total) by mouth 2 (two) times daily. 02/14/21   Ewell Poe  F, PA-C  trimethoprim-polymyxin b (POLYTRIM) ophthalmic solution Place 1 drop into the left eye every 6 (six) hours. 03/24/21   Fenton Foy, NP    Family History Family History  Problem Relation Age of Onset   Diabetes Mother    Hypertension Mother    Varicose Veins Mother     Social History Social History   Tobacco Use   Smoking status: Never   Smokeless tobacco: Never  Vaping Use   Vaping Use: Never used  Substance Use Topics   Alcohol use: Never   Drug use: Never     Allergies   Patient has no known allergies.   Review of Systems Review of Systems Per HPI  Physical Exam Triage Vital Signs ED Triage Vitals  Enc Vitals Group     BP 06/03/21 1203 132/86     Pulse Rate 06/03/21 1203 69     Resp 06/03/21 1203 18     Temp 06/03/21 1203 98.2 F (36.8 C)     Temp Source 06/03/21 1203 Oral     SpO2 06/03/21 1203 98 %     Weight --      Height --      Head Circumference --      Peak Flow --      Pain Score 06/03/21 1204  0     Pain Loc --      Pain Edu? --      Excl. in [place]? --    No data found.  Updated Vital Signs BP 132/86 (BP Location: Right Arm)    Pulse 69    Temp 98.2 F (36.8 C) (Oral)    Resp 18    SpO2 98%    Breastfeeding Yes   Visual Acuity Right Eye Distance:   Left Eye Distance:   Bilateral Distance:    Right Eye Near:   Left Eye Near:    Bilateral Near:     Physical Exam Constitutional:      General: She is not in acute distress.    Appearance: Normal appearance. She is not toxic-appearing or diaphoretic.  HENT:     Head: Normocephalic and atraumatic.  Eyes:     Extraocular Movements: Extraocular movements intact.     Conjunctiva/sclera: Conjunctivae normal.  Pulmonary:     Effort: Pulmonary effort is normal.  Neurological:     General: No focal deficit present.     Mental Status: She is alert and oriented to person, place, and time. Mental status is at baseline.  Psychiatric:        Mood and Affect: Mood normal.        Behavior: Behavior normal.        Thought Content: Thought content normal.        Judgment: Judgment normal.     UC Treatments / Results  Labs (all labs ordered are listed, but only abnormal results are displayed) Labs Reviewed  POCT URINE PREGNANCY    EKG   Radiology No results found.  Procedures Procedures (including critical care time)  Medications Ordered in UC Medications - No data to display  Initial Impression / Assessment and Plan / UC Course  I have reviewed the triage vital signs and the nursing notes.  Pertinent labs & imaging results that were available during my care of the patient were reviewed by me and considered in my medical decision making (see chart for details).     Urine pregnancy test in urgent care was negative, although patient reports 2  positive pregnancy tests at home.  Therefore, in the setting of vaginal bleeding patient will need further evaluation and management at the ER.  Patient was advised to go to the  ER.  Vital signs stable at discharge.  Agree with patient self transport to the hospital. Final Clinical Impressions(s) / UC Diagnoses   Final diagnoses:  Vaginal bleeding     Discharge Instructions      Please go to the hospital as soon as you leave urgent care for further evaluation and management.    ED Prescriptions   None    PDMP not reviewed this encounter.   Teodora Medici, [place] 06/03/21 1240

## 2021-06-03 NOTE — Discharge Instructions (Signed)
Please go to the hospital as soon as you leave urgent care for further evaluation and management. 

## 2021-06-03 NOTE — ED Provider Triage Note (Signed)
Emergency Medicine Provider Triage Evaluation Note  Elizabeth Galvan , a 41 y.o. female  was evaluated in triage.  Pt complains of vaginal bleeding since this morning at 2 AM.  Patient reports last menstrual period was 1/16, she took 2 home pregnancy tests, 1 on Monday and 1 on Tuesday, both were positive.  Patient reports that she had acute onset vaginal bleeding this morning at 2 AM.  She states that there are clots in the bleeding.  She is going through 2 pads since 2 AM.  Review of Systems  Positive: Vaginal bleeding Negative: Abdominal cramping, nausea, vomiting, diarrhea, fevers  Physical Exam  BP (!) 143/73 (BP Location: Left Arm)    Pulse (!) 59    Temp 98.4 F (36.9 C) (Oral)    Resp 14    LMP 04/24/2021 (Exact Date)    SpO2 100%  Gen:   Awake, no distress   Resp:  Normal effort  MSK:   Moves extremities without difficulty  Other:    Medical Decision Making  Medically screening exam initiated at 2:03 PM.  Appropriate orders placed.  Elizabeth Galvan was informed that the remainder of the evaluation will be completed by another provider, this initial triage assessment does not replace that evaluation, and the importance of remaining in the ED until their evaluation is complete.  Accepted to MAU   Azucena Cecil, PA-C 06/03/21 1404

## 2021-06-03 NOTE — MAU Note (Signed)
Presents with c/o VB that began today @ 1400.  Reports hasn't saturated through sanitary napkin, "halfway full".  LMP 04/24/2021.

## 2021-06-03 NOTE — MAU Provider Note (Signed)
Event Date/Time   First Provider Initiated Contact with Patient 06/03/21 1555      S Ms. Elizabeth Galvan is a 41 y.o. G1P1001 patient who presents to MAU today with complaint of vaginal bleeding and positive pregnancy test. Not reviewed from Urgent Care visit, which notes negative pregnancy test in Urgent Care, but wsa still send to the ER for further evaluation. Patient arrived to ER and note reviewed and iStat was performed and found to be 7, so report was called to MAU stating patient was pregnant and bleeding and needed to be seen. Patient states LMP 04/24/21 and reports this bleeding and cramping is less than her normal period and she had not had to take any medication for the cramping. Patient states she has continued to see providers today because Urgent Care made her concerned that something was very wrong when they told her she needed to be seen in the ED. Hemoglobin performed by ED was 15, RH positive.  O BP 139/87 (BP Location: Right Arm)    Pulse 70    Temp 97.9 F (36.6 C) (Oral)    Resp 18    Ht 5\' 5"  (1.651 m)    Wt 117.8 kg    LMP 04/24/2021 (Exact Date)    SpO2 100%    BMI 43.22 kg/m   Patient Vitals for the past 24 hrs:  BP Temp Temp src Pulse Resp SpO2 Height Weight  06/03/21 1545 139/87 97.9 F (36.6 C) Oral 70 18 100 % -- --  06/03/21 1541 -- -- -- -- -- -- 5\' 5"  (1.651 m) 117.8 kg  06/03/21 1316 (!) 143/73 98.4 F (36.9 C) Oral (!) 59 14 100 % -- --   Physical Exam Vitals and nursing note reviewed.  Constitutional:      General: She is not in acute distress.    Appearance: Normal appearance. She is not ill-appearing, toxic-appearing or diaphoretic.  HENT:     Head: Normocephalic and atraumatic.  Pulmonary:     Effort: Pulmonary effort is normal.  Neurological:     Mental Status: She is alert and oriented to person, place, and time.  Psychiatric:        Mood and Affect: Mood normal.        Behavior: Behavior normal.        Thought Content: Thought content normal.         Judgment: Judgment normal.    A Medical screening exam complete UPT negative hCG performed  P Discharge from MAU in stable condition Phone call to Dr. Alesia Richards on call for [facility] - pt states she has appt on Friday for NOB, advised to change to SAB f/u visit, pt aware to keep appt, but that appt type will be changed Warning signs for worsening condition that would warrant emergency follow-up discussed Patient may return to MAU as needed   Jilliann Subramanian, Gerrie Nordmann, NP 06/03/2021 4:18 PM

## 2021-06-03 NOTE — ED Triage Notes (Signed)
Pt here for vaginal bleeding, she states is like a period but not saturating pads that started today. Pt had 2 positive home pregnancy tests last week, LMP 04/24/2021, Reports intermittent cramping on RLQ.

## 2021-06-03 NOTE — ED Triage Notes (Signed)
Pt states last week she tested (+) on pregnancy test. Stated today she started bleeding vaginally and wants to "check that out." States stopped all medications after pregnancy test.

## 2021-06-17 ENCOUNTER — Other Ambulatory Visit: Payer: Managed Care, Other (non HMO)

## 2021-06-27 ENCOUNTER — Ambulatory Visit (INDEPENDENT_AMBULATORY_CARE_PROVIDER_SITE_OTHER): Payer: Managed Care, Other (non HMO) | Admitting: Podiatry

## 2021-06-27 ENCOUNTER — Other Ambulatory Visit: Payer: Self-pay

## 2021-06-27 DIAGNOSIS — M7751 Other enthesopathy of right foot: Secondary | ICD-10-CM | POA: Diagnosis not present

## 2021-06-27 DIAGNOSIS — M722 Plantar fascial fibromatosis: Secondary | ICD-10-CM

## 2021-06-27 MED ORDER — DICLOFENAC SODIUM 1 % EX GEL
2.0000 g | Freq: Four times a day (QID) | CUTANEOUS | 2 refills | Status: AC
Start: 2021-06-27 — End: ?

## 2021-06-27 NOTE — Patient Instructions (Signed)
I have ordered physical therapy for [facility]. If you do not hear for them about scheduling within the next 1 week, or you have any questions please give [place] a call at [phone].  ? ?--- ? ?Plantar Fasciitis (Heel Spur Syndrome) ?with Rehab ?The plantar fascia is a fibrous, ligament-like, soft-tissue structure that spans the bottom of the foot. Plantar fasciitis is a condition that causes pain in the foot due to inflammation of the tissue. ?SYMPTOMS  ?Pain and tenderness on the underneath side of the foot. ?Pain that worsens with standing or walking. ?CAUSES  ?Plantar fasciitis is caused by irritation and injury to the plantar fascia on the underneath side of the foot. Common mechanisms of injury include: ?Direct trauma to bottom of the foot. ?Damage to a small nerve that runs under the foot where the main fascia attaches to the heel bone. ?Stress placed on the plantar fascia due to bone spurs. ?RISK INCREASES WITH:  ?Activities that place stress on the plantar fascia (running, jumping, pivoting, or cutting). ?Poor strength and flexibility. ?Improperly fitted shoes. ?Tight calf muscles. ?Flat feet. ?Failure to warm-up properly before activity. ?Obesity. ?PREVENTION ?Warm up and stretch properly before activity. ?Allow for adequate recovery between workouts. ?Maintain physical fitness: ?Strength, flexibility, and endurance. ?Cardiovascular fitness. ?Maintain a health body weight. ?Avoid stress on the plantar fascia. ?Wear properly fitted shoes, including arch supports for individuals who have flat feet. ? ?PROGNOSIS  ?If treated properly, then the symptoms of plantar fasciitis usually resolve without surgery. However, occasionally surgery is necessary. ? ?RELATED COMPLICATIONS  ?Recurrent symptoms that may result in a chronic condition. ?Problems of the lower back that are caused by compensating for the injury, such as limping. ?Pain or weakness of the foot during push-off following surgery. ?Chronic  inflammation, scarring, and partial or complete fascia tear, occurring more often from repeated injections. ? ?TREATMENT  ?Treatment initially involves the use of ice and medication to help reduce pain and inflammation. The use of strengthening and stretching exercises may help reduce pain with activity, especially stretches of the Achilles tendon. These exercises may be performed at home or with a therapist. Your caregiver may recommend that you use heel cups of arch supports to help reduce stress on the plantar fascia. Occasionally, corticosteroid injections are given to reduce inflammation. If symptoms persist for greater than 6 months despite non-surgical (conservative), then surgery may be recommended.  ? ?MEDICATION  ?If pain medication is necessary, then nonsteroidal anti-inflammatory medications, such as aspirin and ibuprofen, or other minor pain relievers, such as acetaminophen, are often recommended. ?Do not take pain medication within 7 days before surgery. ?Prescription pain relievers may be given if deemed necessary by your caregiver. Use only as directed and only as much as you need. ?Corticosteroid injections may be given by your caregiver. These injections should be reserved for the most serious cases, because they may only be given a certain number of times. ? ?HEAT AND COLD ?Cold treatment (icing) relieves pain and reduces inflammation. Cold treatment should be applied for 10 to 15 minutes every 2 to 3 hours for inflammation and pain and immediately after any activity that aggravates your symptoms. Use ice packs or massage the area with a piece of ice (ice massage). ?Heat treatment may be used prior to performing the stretching and strengthening activities prescribed by your caregiver, physical therapist, or athletic trainer. Use a heat pack or soak the injury in warm water. ? ?SEEK IMMEDIATE MEDICAL CARE IF: ?Treatment seems to offer no benefit,  or the condition worsens. ?Any medications produce  adverse side effects. ? ?EXERCISES- ?RANGE OF MOTION (ROM) AND STRETCHING EXERCISES - Plantar Fasciitis (Heel Spur Syndrome) ?These exercises may help you when beginning to rehabilitate your injury. Your symptoms may resolve with or without further involvement from your physician, physical therapist or athletic trainer. While completing these exercises, remember:  ?Restoring tissue flexibility helps normal motion to return to the joints. This allows healthier, less painful movement and activity. ?An effective stretch should be held for at least 30 seconds. ?A stretch should never be painful. You should only feel a gentle lengthening or release in the stretched tissue. ? ?RANGE OF MOTION - Toe Extension, Flexion ?Sit with your right / left leg crossed over your opposite knee. ?Grasp your toes and gently pull them back toward the top of your foot. You should feel a stretch on the bottom of your toes and/or foot. ?Hold this stretch for 10 seconds. ?Now, gently pull your toes toward the bottom of your foot. You should feel a stretch on the top of your toes and or foot. ?Hold this stretch for 10 seconds. ?Repeat  times. Complete this stretch 3 times per day.  ? ?RANGE OF MOTION - Ankle Dorsiflexion, Active Assisted ?Remove shoes and sit on a chair that is preferably not on a carpeted surface. ?Place right / left foot under knee. Extend your opposite leg for support. ?Keeping your heel down, slide your right / left foot back toward the chair until you feel a stretch at your ankle or calf. If you do not feel a stretch, slide your bottom forward to the edge of the chair, while still keeping your heel down. ?Hold this stretch for 10 seconds. ?Repeat 3 times. Complete this stretch 2 times per day.  ? ?STRETCH  Gastroc, Standing ?Place hands on wall. ?Extend right / left leg, keeping the front knee somewhat bent. ?Slightly point your toes inward on your back foot. ?Keeping your right / left heel on the floor and your knee  straight, shift your weight toward the wall, not allowing your back to arch. ?You should feel a gentle stretch in the right / left calf. Hold this position for 10 seconds. ?Repeat 3 times. Complete this stretch 2 times per day. ? ?STRETCH  Soleus, Standing ?Place hands on wall. ?Extend right / left leg, keeping the other knee somewhat bent. ?Slightly point your toes inward on your back foot. ?Keep your right / left heel on the floor, bend your back knee, and slightly shift your weight over the back leg so that you feel a gentle stretch deep in your back calf. ?Hold this position for 10 seconds. ?Repeat 3 times. Complete this stretch 2 times per day. ? ?Google, Standing  ?Note: This exercise can place a lot of stress on your foot and ankle. Please complete this exercise only if specifically instructed by your caregiver.  ?Place the ball of your right / left foot on a step, keeping your other foot firmly on the same step. ?Hold on to the wall or a rail for balance. ?Slowly lift your other foot, allowing your body weight to press your heel down over the edge of the step. ?You should feel a stretch in your right / left calf. ?Hold this position for 10 seconds. ?Repeat this exercise with a slight bend in your right / left knee. ?Repeat 3 times. Complete this stretch 2 times per day.  ? ?STRENGTHENING EXERCISES - Plantar Fasciitis (Heel Spur Syndrome)  ?  These exercises may help you when beginning to rehabilitate your injury. They may resolve your symptoms with or without further involvement from your physician, physical therapist or athletic trainer. While completing these exercises, remember:  ?Muscles can gain both the endurance and the strength needed for everyday activities through controlled exercises. ?Complete these exercises as instructed by your physician, physical therapist or athletic trainer. Progress the resistance and repetitions only as guided. ? ?STRENGTH - Towel Curls ?Sit in a chair  positioned on a non-carpeted surface. ?Place your foot on a towel, keeping your heel on the floor. ?Pull the towel toward your heel by only curling your toes. Keep your heel on the floor. ?Repeat 3 times. Complete this e

## 2021-06-29 DIAGNOSIS — M722 Plantar fascial fibromatosis: Secondary | ICD-10-CM | POA: Insufficient documentation

## 2021-06-29 NOTE — Progress Notes (Signed)
Subjective: ?41 year old female presents the office today for follow evaluation of right foot pain.  Unfortunately MRI was not covered by insurance.  She states that she still gets discomfort to the bottom of the heel.  She is using the plantar fascia ice which helps some.  Patient is continuous but varies as far as this severity.  No recent injury or trauma.  No radiating pain.  No other concerns. ? ?Objective: ?AAO x3, NAD ?DP/PT pulses palpable bilaterally, CRT less than 3 seconds ?There is tenderness palpation along the right foot on the course, insertion of plantar fascia at the plantar aspect the heel.  There is no pain with lateral compression of the calcaneus.  There is mild discomfort noted along the peroneal tendons.  Flexor, extensor tendons are clinically appear to be intact.  MMT 5/5. ?No pain with calf compression, swelling, warmth, erythema ? ?Assessment: ?Right continued heel pain, rule out partial tear of plantar fascia ? ?Plan: ?-All treatment options discussed with the patient including all alternatives, risks, complications.  ?-Unfortunately MRI not covered by insurance.  I would then send her to physical therapy and order was written for [facility].  We discussed wearing shoes with arch support as well as she is wearing shoes that are flat today.  She can use Voltaren gel and discussed stretching, icing daily as well.  Continue with the plantar fascial brace. ? ?Trula Slade DPM ? ?

## 2021-07-06 ENCOUNTER — Encounter: Payer: Self-pay | Admitting: Internal Medicine

## 2021-07-06 ENCOUNTER — Ambulatory Visit (INDEPENDENT_AMBULATORY_CARE_PROVIDER_SITE_OTHER): Payer: Medicaid Other | Admitting: Internal Medicine

## 2021-07-06 VITALS — BP 120/78 | HR 74 | Ht 65.0 in | Wt 262.0 lb

## 2021-07-06 DIAGNOSIS — D352 Benign neoplasm of pituitary gland: Secondary | ICD-10-CM | POA: Diagnosis not present

## 2021-07-06 DIAGNOSIS — E221 Hyperprolactinemia: Secondary | ICD-10-CM

## 2021-07-06 LAB — CORTISOL: Cortisol, Plasma: 3.7 ug/dL

## 2021-07-06 LAB — T4, FREE: Free T4: 0.78 ng/dL (ref 0.60–1.60)

## 2021-07-06 LAB — TSH: TSH: 1.17 u[IU]/mL (ref 0.35–5.50)

## 2021-07-06 NOTE — Progress Notes (Signed)
? ? ?Name: Elizabeth Galvan  ?MRN/ DOB: [id number], [birth year]    ?Age/ Sex: 41 y.o., female   ? ?PCP: Camillia Herter, NP   ?Reason for Endocrinology Evaluation: Elevated prolactin  ?   ?Date of Initial Endocrinology Evaluation: 07/07/2021   ? ? ?HPI: ?Ms. Elizabeth Galvan is a 41 y.o. female with a past medical history of Migraine headaches . The patient presented for initial endocrinology clinic visit on 07/07/2021 for consultative assistance with her elevated prolactin.  ? ? ? ?She has been diagnosed with pituitary microadenoma in 2019 while living in [place], she was under the care of endocrinology  ? ?She had hx of elevated prolactin and was on Cabergoline until her move here in 2022 when she ran out of Cabergoline  ?No hx of infertility  ? ?Moved to [place] in 2022 ? ?She is S/P delivery 08/27/2020  ? ?She has never nursed  ? ? ?Patient presents to the ED 05/2021 with vaginal bleed status post pregnancy test, with miscarriage  ?She has been following up with podiatry for plantar fasciitis ? ?LMP 07/03/2021, has regular mensuration  ? ?Migraine headaches are described " bad" but stable  ?Denies galactorrhea  ?Denies visual changes  ?Weight stable  ?Denies constipation or diarrhea  ?Denies palpitations  ? ? ? ?HISTORY:  ?Past Medical History:  ?Past Medical History:  ?Diagnosis Date  ? Headache   ? migraines  ? Obesity   ? Ovarian cyst   ? Pituitary adenoma ([place])   ? ?Past Surgical History:  ?Past Surgical History:  ?Procedure Laterality Date  ? LAPAROSCOPIC GASTRIC SLEEVE RESECTION    ? OVARIAN CYST REMOVAL    ?  ?Social History:  reports that she has never smoked. She has never used smokeless tobacco. She reports that she does not drink alcohol and does not use drugs. ?Family History: family history includes Diabetes in her mother; Hypertension in her mother; Varicose Veins in her mother. ? ? ?HOME MEDICATIONS: ?Allergies as of 07/06/2021   ?No Known Allergies ?  ? ?  ?Medication List  ?  ? ?  ? Accurate as of July 06, 2021 11:59  PM. If you have any questions, ask your nurse or doctor.  ?  ?  ? ?  ? ?STOP taking these medications   ? ?atorvastatin 20 MG tablet ?Commonly known as: LIPITOR ?Stopped by: Dorita Sciara, MD ?  ?benzonatate 100 MG capsule ?Commonly known as: TESSALON ?Stopped by: Dorita Sciara, MD ?  ?cyclobenzaprine 10 MG tablet ?Commonly known as: FLEXERIL ?Stopped by: Dorita Sciara, MD ?  ?ibuprofen 600 MG tablet ?Commonly known as: ADVIL ?Stopped by: Dorita Sciara, MD ?  ?methylPREDNISolone 4 MG Tbpk tablet ?Commonly known as: MEDROL DOSEPAK ?Stopped by: Dorita Sciara, MD ?  ?naproxen 500 MG tablet ?Commonly known as: NAPROSYN ?Stopped by: Dorita Sciara, MD ?  ?trimethoprim-polymyxin b ophthalmic solution ?Commonly known as: Polytrim ?Stopped by: Dorita Sciara, MD ?  ? ?  ? ?TAKE these medications   ? ?acetaminophen 650 MG CR tablet ?Commonly known as: Tylenol 8 Hour ?Take 1 tablet (650 mg total) by mouth every 8 (eight) hours as needed for pain. ?  ?calcium-vitamin D 500-200 MG-UNIT tablet ?Commonly known as: OSCAL WITH D ?Take 1 tablet by mouth. ?  ?diclofenac Sodium 1 % Gel ?Commonly known as: VOLTAREN ?Apply 2 g topically 4 (four) times daily. Rub into affected area of foot 2 to 4 times daily ?  ?fluticasone 50 MCG/ACT nasal  spray ?Commonly known as: FLONASE ?Place 2 sprays into both nostrils daily. ?  ?Iron (Ferrous Sulfate) 325 (65 Fe) MG Tabs ?1 tablet ?  ?loratadine 10 MG tablet ?Commonly known as: CLARITIN ?Take 1 tablet (10 mg total) by mouth daily. ?  ?meloxicam 7.5 MG tablet ?Commonly known as: MOBIC ?Take 1 tablet (7.5 mg total) by mouth daily. ?What changed: Another medication with the same name was removed. Continue taking this medication, and follow the directions you see here. ?Changed by: Dorita Sciara, MD ?  ? ?  ?  ? ? ?REVIEW OF SYSTEMS: ?A comprehensive ROS was conducted with the patient and is negative except as per HPI  ? ?OBJECTIVE:  ?VS: BP 120/78  (BP Location: Left Arm, Patient Position: Sitting, Cuff Size: Small)   Pulse 74   Ht '5\' 5"'$  (1.651 m)   Wt 262 lb (118.8 kg)   SpO2 99%   BMI 43.60 kg/m?   ? ?Wt Readings from Last 3 Encounters:  ?07/06/21 262 lb (118.8 kg)  ?06/03/21 259 lb 11.2 oz (117.8 kg)  ?04/18/21 259 lb (117.5 kg)  ? ? ? ?EXAM: ?General: Pt appears well and is in NAD  ?Eyes: External eye exam normal without stare, lid lag or exophthalmos.  EOM intact.   ?Neck: General: Supple without adenopathy. ?Thyroid: Thyroid size normal.  No goiter or nodules appreciated.  ?Lungs: Clear with good BS bilat with no rales, rhonchi, or wheezes  ?Heart: Auscultation: RRR.  ?Abdomen: Normoactive bowel sounds, soft, nontender, without masses or organomegaly palpable  ?Extremities:  ?BL LE: No pretibial edema normal ROM and strength.  ?Mental Status: Judgment, insight: Intact ?Orientation: Oriented to time, place, and person ?Mood and affect: No depression, anxiety, or agitation  ? ? ? ?DATA REVIEWED: ? ?  ? Latest Reference Range & Units 07/06/21 13:53  ?Cortisol, Plasma ug/dL 3.7  ?Prolactin ng/mL 18.4  ?TSH 0.35 - 5.50 uIU/mL 1.17  ?T4,Free(Direct) 0.60 - 1.60 ng/dL 0.78  ? ? ?ASSESSMENT/PLAN/RECOMMENDATIONS:  ? ?Pituitary Microadenoma : ? ?-No local symptoms ?-No visual changes ?-No clinical concern for hypo or hypopituitarism ?-TFTs and cortisol normal as well as prolactin ?-We will proceed with MRI of the brain ? ? ? ?2. Prolactinoma :  ? ?-She has history of hyperprolactinemia a few years ago requiring cabergoline ?-She had slight elevation of prolactin in 11/2020 ?-Repeat prolactin levels today are normal ?-No medication will be offered ? ?Follow-up in 4 months ? ?Signed electronically by: ?Abby Nena Jordan, MD ? ?[facility]  ?[facility] ?[address] ?[place], [place] [address] ?Phone: [phone] ?FAX: [phone] ? ? ?CC: ?Camillia Herter, NP ?[address] ?[place] [address] ?Phone:  [phone] ?Fax: [phone] ? ? ?Return to Endocrinology clinic as below: ?Future Appointments  ?Date Time Provider [place]  ?07/27/2021 11:30 AM Jacqualyn Posey Bonna Gains, DPM [facility]  ?11/21/2021 11:50 AM Nahomi Hegner, Melanie Crazier, MD [facility] None  ?  ? ? ? ? ? ?

## 2021-07-07 DIAGNOSIS — E221 Hyperprolactinemia: Secondary | ICD-10-CM | POA: Insufficient documentation

## 2021-07-11 LAB — INSULIN-LIKE GROWTH FACTOR
IGF-I, LC/MS: 94 ng/mL (ref 53–331)
Z-Score (Female): -0.9 SD (ref ?–2.0)

## 2021-07-11 LAB — PROLACTIN: Prolactin: 18.4 ng/mL

## 2021-07-11 LAB — ACTH: C206 ACTH: 10 pg/mL (ref 6–50)

## 2021-07-27 ENCOUNTER — Ambulatory Visit: Payer: Managed Care, Other (non HMO) | Admitting: Podiatry

## 2021-07-28 ENCOUNTER — Encounter: Payer: Self-pay | Admitting: Podiatry

## 2021-08-02 ENCOUNTER — Other Ambulatory Visit: Payer: Managed Care, Other (non HMO)

## 2021-08-03 ENCOUNTER — Ambulatory Visit (INDEPENDENT_AMBULATORY_CARE_PROVIDER_SITE_OTHER): Payer: Managed Care, Other (non HMO) | Admitting: Physician Assistant

## 2021-08-03 ENCOUNTER — Encounter: Payer: Self-pay | Admitting: Physician Assistant

## 2021-08-03 VITALS — BP 127/82 | HR 103 | Temp 98.2°F | Resp 18 | Ht 65.0 in

## 2021-08-03 DIAGNOSIS — N912 Amenorrhea, unspecified: Secondary | ICD-10-CM

## 2021-08-03 DIAGNOSIS — Z3201 Encounter for pregnancy test, result positive: Secondary | ICD-10-CM | POA: Diagnosis not present

## 2021-08-03 DIAGNOSIS — J029 Acute pharyngitis, unspecified: Secondary | ICD-10-CM

## 2021-08-03 LAB — POCT URINE PREGNANCY: Preg Test, Ur: POSITIVE — AB

## 2021-08-03 NOTE — Progress Notes (Signed)
? ?Established Patient Office Visit ? ?Subjective   ?Patient ID: Bintou Lafata, female    DOB: [birth year]  Age: 41 y.o. MRN: [id number] ? ?Chief Complaint  ?Patient presents with  ? Sore Throat  ? ? ?States that she had originally made her appointment for pregnancy testing, states however she was seen at her gynecology office earlier today and they did do a blood draw for pregnancy testing. ? ?States that she however would like to be seen for a sore throat, states that she started having this last night, states that she has been eating and drinking okay.  Denies sick contacts, denies any other upper respiratory symptoms.  Has not tried anything for relief.  ? ? ?Past Medical History:  ?Diagnosis Date  ? Headache   ? migraines  ? Obesity   ? Ovarian cyst   ? Pituitary adenoma ([place])   ? ?Social History  ? ?Socioeconomic History  ? Marital status: Married  ?  Spouse name: Not on file  ? Number of children: Not on file  ? Years of education: Not on file  ? Highest education level: Not on file  ?Occupational History  ? Not on file  ?Tobacco Use  ? Smoking status: Never  ? Smokeless tobacco: Never  ?Vaping Use  ? Vaping Use: Never used  ?Substance and Sexual Activity  ? Alcohol use: Never  ? Drug use: Never  ? Sexual activity: Yes  ?  Birth control/protection: None  ?Other Topics Concern  ? Not on file  ?Social History Narrative  ? Not on file  ? ?Social Determinants of Health  ? ?Financial Resource Strain: Not on file  ?Food Insecurity: Not on file  ?Transportation Needs: Not on file  ?Physical Activity: Not on file  ?Stress: Not on file  ?Social Connections: Not on file  ?Intimate Partner Violence: Not on file  ? ?Family History  ?Problem Relation Age of Onset  ? Diabetes Mother   ? Hypertension Mother   ? Varicose Veins Mother   ? ?No Known Allergies ?  ? ?Review of Systems  ?Constitutional:  Negative for chills and fever.  ?HENT:  Positive for sore throat. Negative for congestion, ear pain and sinus pain.   ?Eyes:  Negative.   ?Respiratory:  Negative for cough and shortness of breath.   ?Cardiovascular:  Negative for chest pain.  ?Gastrointestinal:  Negative for abdominal pain, nausea and vomiting.  ?Genitourinary: Negative.   ?Musculoskeletal:  Negative for myalgias.  ?Skin: Negative.   ?Neurological:  Negative for headaches.  ?Endo/Heme/Allergies: Negative.   ?Psychiatric/Behavioral: Negative.    ? ?  ?Objective:  ?  ? ?Physical Exam ?Vitals and nursing note reviewed.  ?Constitutional:   ?   Appearance: She is well-developed. She is obese.  ?HENT:  ?   Head: Normocephalic and atraumatic.  ?   Right Ear: Tympanic membrane and ear canal normal.  ?   Left Ear: Tympanic membrane and ear canal normal.  ?   Nose: Nose normal. No congestion.  ?   Mouth/Throat:  ?   Mouth: Mucous membranes are moist.  ?   Tonsils: No tonsillar exudate. 0 on the right. 0 on the left.  ?Eyes:  ?   Conjunctiva/sclera: Conjunctivae normal.  ?   Pupils: Pupils are equal, round, and reactive to light.  ?Cardiovascular:  ?   Rate and Rhythm: Normal rate and regular rhythm.  ?   Pulses: Normal pulses.  ?   Heart sounds: Normal heart sounds.  ?Pulmonary:  ?  Effort: Pulmonary effort is normal.  ?   Breath sounds: Normal breath sounds.  ?Musculoskeletal:     ?   General: Normal range of motion.  ?   Cervical back: Normal range of motion and neck supple.  ?Lymphadenopathy:  ?   Cervical: No cervical adenopathy.  ?Skin: ?   General: Skin is warm and dry.  ?Neurological:  ?   General: No focal deficit present.  ?   Mental Status: She is alert and oriented to person, place, and time.  ?Psychiatric:     ?   Mood and Affect: Mood normal.     ?   Behavior: Behavior normal.     ?   Thought Content: Thought content normal.     ?   Judgment: Judgment normal.  ? ? ?  ?Assessment & Plan:  ? ?Problem List Items Addressed This Visit   ?None ?Visit Diagnoses   ? ? Sore throat    -  Primary  ? Amenorrhea      ? Relevant Orders  ? POCT urine pregnancy (Completed)  ?  Encounter for pregnancy test, result positive      ? ?  ? ?1. Sore throat ?Patient education given on supportive care, red flags given for prompt reevaluation ? ?2. Amenorrhea ?Pregnancy test positive, patient will continue follow-up with OB ?- POCT urine pregnancy ? ?3. Encounter for pregnancy test, result positive ? ? ? ?I have reviewed the patient's medical history (PMH, PSH, Social History, Family History, Medications, and allergies) , and have been updated if relevant. I spent 20 minutes reviewing chart and  face to face time with patient. ? ? ? ?Return if symptoms worsen or fail to improve.  ? ? ?Salvatore Poe S Mayers, PA-C ? ?

## 2021-08-03 NOTE — Progress Notes (Signed)
Patient reports sore throat beginning ast night. Throat feels dry. No other Sx, no persons around her with Sx. ?Patient [place] being [place] by OB [place] ? ? ?

## 2021-08-03 NOTE — Patient Instructions (Signed)
I do encourage you to stay very well-hydrated, get lots of rest, and you can also use some throat lozenges to help you with your sore throat. ? ?I hope that you feel better soon. ? ?Kennieth Rad, PA-C ?Physician Assistant ?[facility] ?[email or web address] ? ? ?Sore Throat ?A sore throat is pain, burning, irritation, or scratchiness in the throat. When you have a sore throat, you may feel pain or tenderness in your throat when you swallow or talk. ?Many things can cause a sore throat, including: ?An infection. ?Seasonal allergies. ?Dryness in the air. ?Irritants, such as smoke or pollution. ?Radiation treatment for cancer. ?Gastroesophageal reflux disease (GERD). ?A tumor. ?A sore throat is often the first sign of another sickness. It may happen with other symptoms, such as coughing, sneezing, fever, and swollen neck glands. Most sore throats go away without medical treatment. ?Follow these instructions at home: ? ?  ? ?Medicines ?Take over-the-counter and prescription medicines only as told by your health care provider. ?Children often get sore throats. Do not give your child aspirin because of the association with Reye's syndrome. ?Use throat sprays to soothe your throat as told by your health care provider. ?Managing pain ?To help with pain, try: ?Sipping warm liquids, such as broth, herbal tea, or warm water. ?Eating or drinking cold or frozen liquids, such as frozen ice pops. ?Gargling with a mixture of salt and water 3-4 times a day or as needed. To make salt water, completely dissolve ?-1 tsp (3-6 g) of salt in 1 cup (237 mL) of warm water. ?Sucking on hard candy or throat lozenges. ?Putting a cool-mist humidifier in your bedroom at night to moisten the air. ?Sitting in the bathroom with the door closed for 5-10 minutes while you run hot water in the shower. ?General instructions ?Do not use any products that contain nicotine or tobacco. These products  include cigarettes, chewing tobacco, and vaping devices, such as e-cigarettes. If you need help quitting, ask your health care provider. ?Rest as needed. ?Drink enough fluid to keep your urine pale yellow. ?Wash your hands often with soap and water for at least 20 seconds. If soap and water are not available, use hand sanitizer. ?Contact a health care provider if: ?You have a fever for more than 2-3 days. ?You have symptoms that last for more than 2-3 days. ?Your throat does not get better within 7 days. ?You have a fever and your symptoms suddenly get worse. ?Get help right away if: ?You have difficulty breathing. ?You cannot swallow fluids, soft foods, or your saliva. ?You have increased swelling in your throat or neck. ?You have persistent nausea and vomiting. ?These symptoms may represent a serious problem that is an emergency. Do not wait to see if the symptoms will go away. Get medical help right away. Call your local emergency services (911 in the [place] Do not drive yourself to the hospital. ?Summary ?A sore throat is pain, burning, irritation, or scratchiness in the throat. Many things can cause a sore throat. ?Take over-the-counter medicines only as told by your health care provider. ?Rest as needed. ?Drink enough fluid to keep your urine pale yellow. ?Contact a health care provider if your throat does not get better within 7 days. ?This information is not intended to replace advice given to you by your health care provider. Make sure you discuss any questions you have with your health care provider. ?Document Revised: 06/22/2020 Document Reviewed: 06/22/2020 ?Elsevier Patient Education ? [place]. ? ?

## 2021-08-17 ENCOUNTER — Ambulatory Visit: Payer: Managed Care, Other (non HMO) | Admitting: Podiatry

## 2021-11-21 ENCOUNTER — Ambulatory Visit: Payer: Managed Care, Other (non HMO) | Admitting: Internal Medicine

## 2021-11-21 NOTE — Progress Notes (Deleted)
Name: Elizabeth Galvan  MRN/ DOB: [id number], Oct 08, 1980    Age/ Sex: 41 y.o., female    PCP: Camillia Herter, NP   Reason for Endocrinology Evaluation: Elevated prolactin     Date of Initial Endocrinology Evaluation: 07/06/2021    HPI: Elizabeth Galvan is a 41 y.o. female with a past medical history of Migraine headaches . The patient presented for initial endocrinology clinic visit on 07/06/2021 for consultative assistance with her elevated prolactin.     She has been diagnosed with pituitary microadenoma in 2019 while living in [place], she was under the care of endocrinology   She had hx of elevated prolactin and was on Cabergoline until her move here in 2022 when she ran out of Cabergoline  No hx of infertility   Moved to [place] in 2022  She is S/P delivery 08/27/2020   She has never nursed    Patient presents to the ED 05/2021 with vaginal bleed status post pregnancy test, with miscarriage   On her initial visit to our clinic prolactin level was normalized, I ordered an MRI in March 2023 but by August this has not been done  Patient returned to our office while pregnant  Migraine headaches are described " bad" but stable  Denies galactorrhea  Denies visual changes  Weight stable  Denies constipation or diarrhea  Denies palpitations   SUBJECTIVE:     Today (11/21/21):  Elizabeth Galvan is here for follow-up on history of pituitary microadenoma.  She is currently pregnant at***    HISTORY:  Past Medical History:  Past Medical History:  Diagnosis Date   Headache    migraines   Obesity    Ovarian cyst    Pituitary adenoma ([place])    Past Surgical History:  Past Surgical History:  Procedure Laterality Date   LAPAROSCOPIC GASTRIC SLEEVE RESECTION     OVARIAN CYST REMOVAL      Social History:  reports that she has never smoked. She has never used smokeless tobacco. She reports that she does not drink alcohol and does not use drugs. Family History: family history includes  Diabetes in her mother; Hypertension in her mother; Varicose Veins in her mother.   HOME MEDICATIONS: Allergies as of 11/21/2021   No Known Allergies      Medication List        Accurate as of November 21, 2021  7:24 AM. If you have any questions, ask your nurse or doctor.          acetaminophen 650 MG CR tablet Commonly known as: Tylenol 8 Hour Take 1 tablet (650 mg total) by mouth every 8 (eight) hours as needed for pain.   calcium-vitamin D 500-200 MG-UNIT tablet Commonly known as: OSCAL WITH D Take 1 tablet by mouth.   diclofenac Sodium 1 % Gel Commonly known as: VOLTAREN Apply 2 g topically 4 (four) times daily. Rub into affected area of foot 2 to 4 times daily   fluticasone 50 MCG/ACT nasal spray Commonly known as: FLONASE Place 2 sprays into both nostrils daily.   Iron (Ferrous Sulfate) 325 (65 Fe) MG Tabs 1 tablet   loratadine 10 MG tablet Commonly known as: CLARITIN Take 1 tablet (10 mg total) by mouth daily.   meloxicam 7.5 MG tablet Commonly known as: MOBIC Take 1 tablet (7.5 mg total) by mouth daily.          REVIEW OF SYSTEMS: A comprehensive ROS was conducted with the patient and is negative except as  per HPI   OBJECTIVE:  VS: LMP 06/29/2021    Wt Readings from Last 3 Encounters:  07/06/21 262 lb (118.8 kg)  06/03/21 259 lb 11.2 oz (117.8 kg)  04/18/21 259 lb (117.5 kg)     EXAM: General: Pt appears well and is in NAD  Eyes: External eye exam normal without stare, lid lag or exophthalmos.  EOM intact.   Neck: General: Supple without adenopathy. Thyroid: Thyroid size normal.  No goiter or nodules appreciated.  Lungs: Clear with good BS bilat with no rales, rhonchi, or wheezes  Heart: Auscultation: RRR.  Abdomen: Normoactive bowel sounds, soft, nontender, without masses or organomegaly palpable  Extremities:  BL LE: No pretibial edema normal ROM and strength.  Mental Status: Judgment, insight: Intact Orientation: Oriented to time,  place, and person Mood and affect: No depression, anxiety, or agitation     DATA REVIEWED:     Latest Reference Range & Units 07/06/21 13:53  Cortisol, Plasma ug/dL 3.7  Prolactin ng/mL 18.4  TSH 0.35 - 5.50 uIU/mL 1.17  T4,Free(Direct) 0.60 - 1.60 ng/dL 0.78    ASSESSMENT/PLAN/RECOMMENDATIONS:   Pituitary Microadenoma :  -No local symptoms -No visual changes -No clinical concern for hypo or hypopituitarism -TFTs and cortisol normal as well as prolactin -We will proceed with MRI of the brain    2. Prolactinoma :   -She has history of hyperprolactinemia a few years ago requiring cabergoline -She had slight elevation of prolactin in 11/2020 -Repeat prolactin levels today are normal -No medication will be offered  Follow-up in 4 months  Signed electronically by: Mack Guise, MD  [facility] Endocrinology  [facility] [facility] [address] [place], [place] [address] Phone: [phone] FAX: [phone]   CC: Camillia Herter, NP [place] [address] Phone: [phone] Fax: [phone]   Return to Endocrinology clinic as below: Future Appointments  Date Time Provider [place]  11/21/2021 11:50 AM Emoree Sasaki, Melanie Crazier, MD [facility] None

## 2021-12-04 ENCOUNTER — Other Ambulatory Visit: Payer: Self-pay | Admitting: Family

## 2021-12-04 DIAGNOSIS — U071 COVID-19: Secondary | ICD-10-CM

## 2023-04-06 IMAGING — CR DG LUMBAR SPINE COMPLETE 4+V
5 series · 5 of 5 positions shown · non-contrast
Comparison: None.

CLINICAL DATA: Back pain, left radicular pain

EXAM:
LUMBAR SPINE - COMPLETE 4+ VIEW

[t lumbar spine ap]
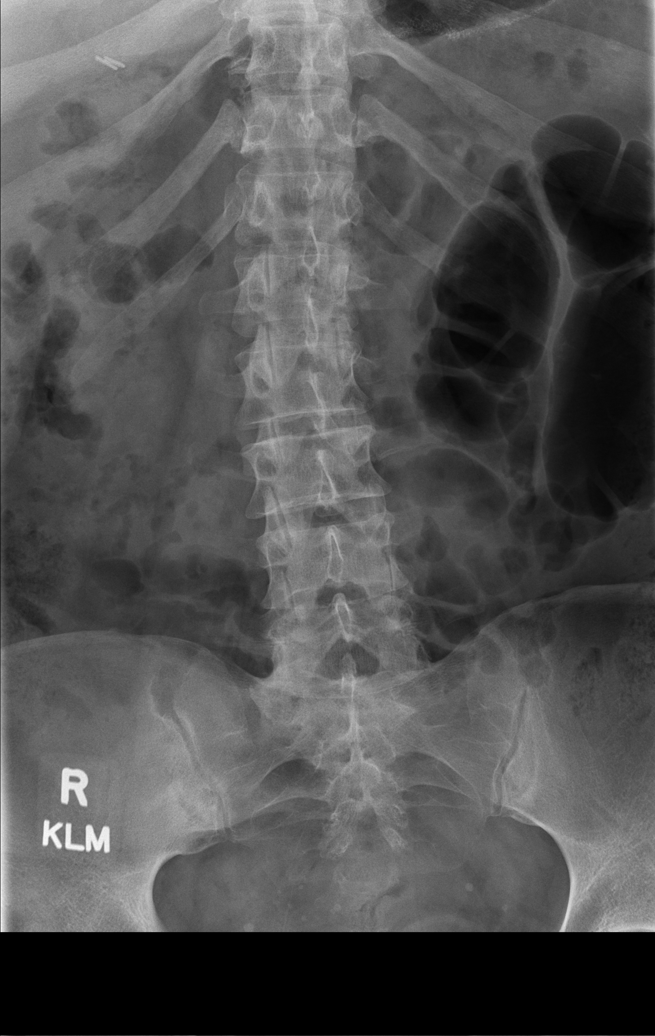

[t lumbar spine obl (1 of 2)]
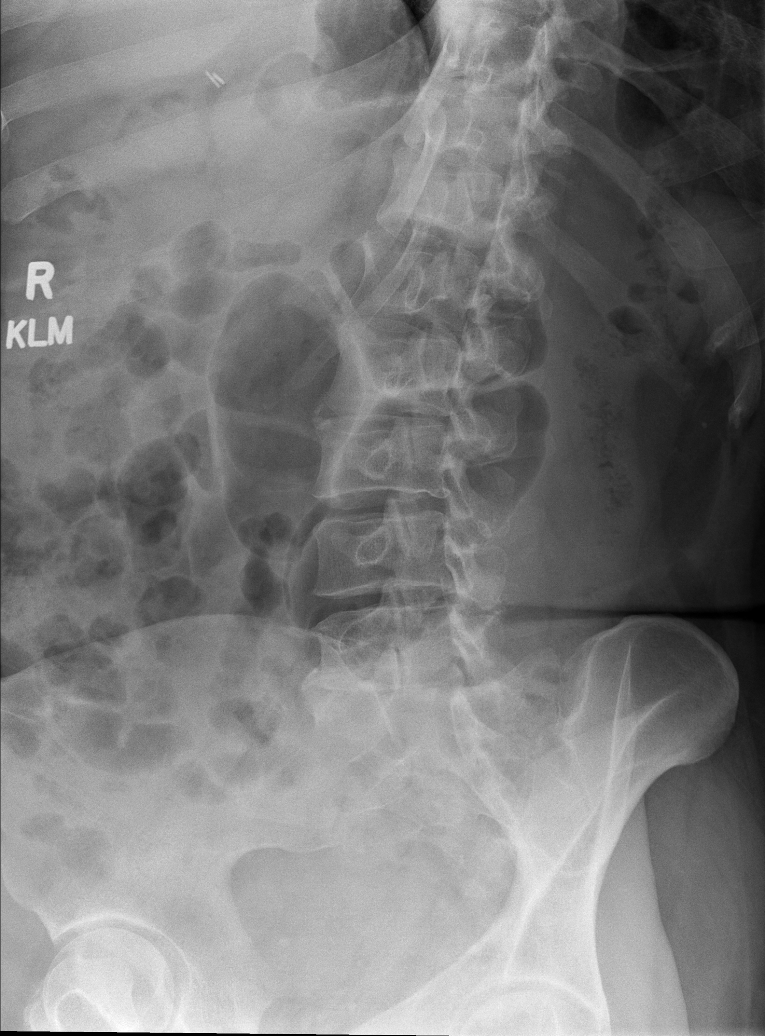

[t lumbar spine obl (2 of 2)]
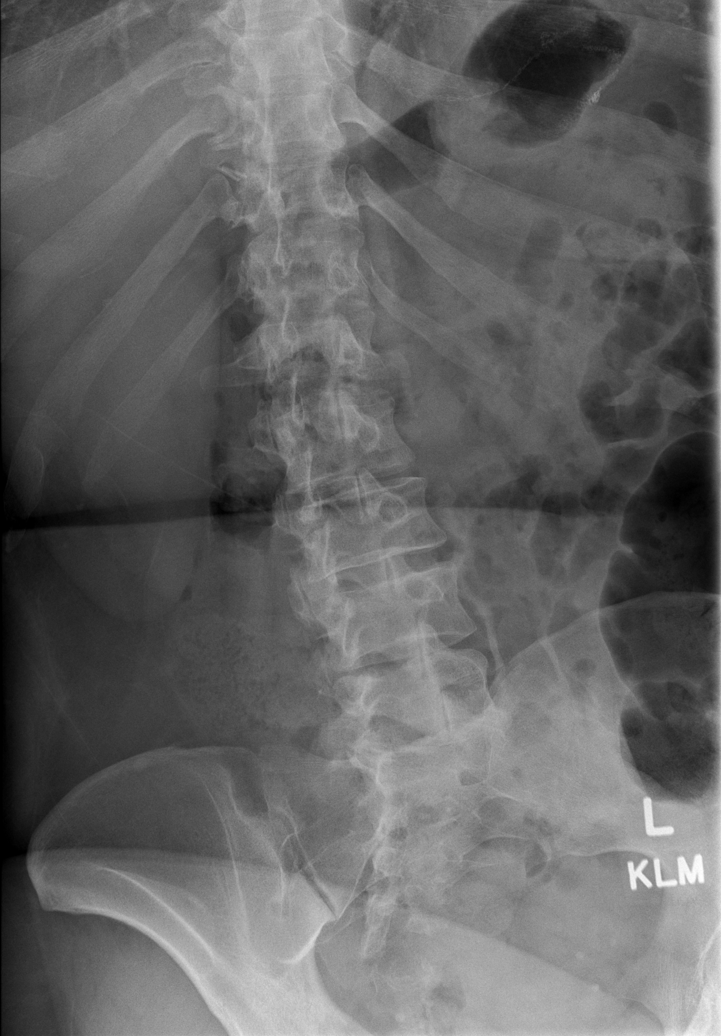

[t lumbar spine lat]
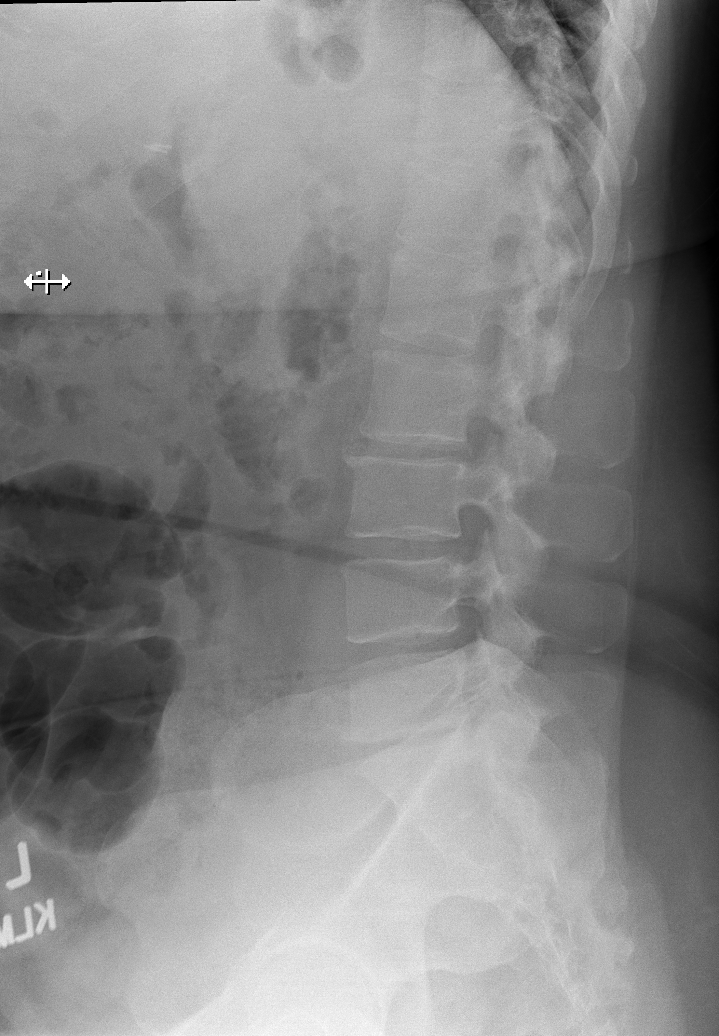

[t lumbar l-5 s-1 spot]
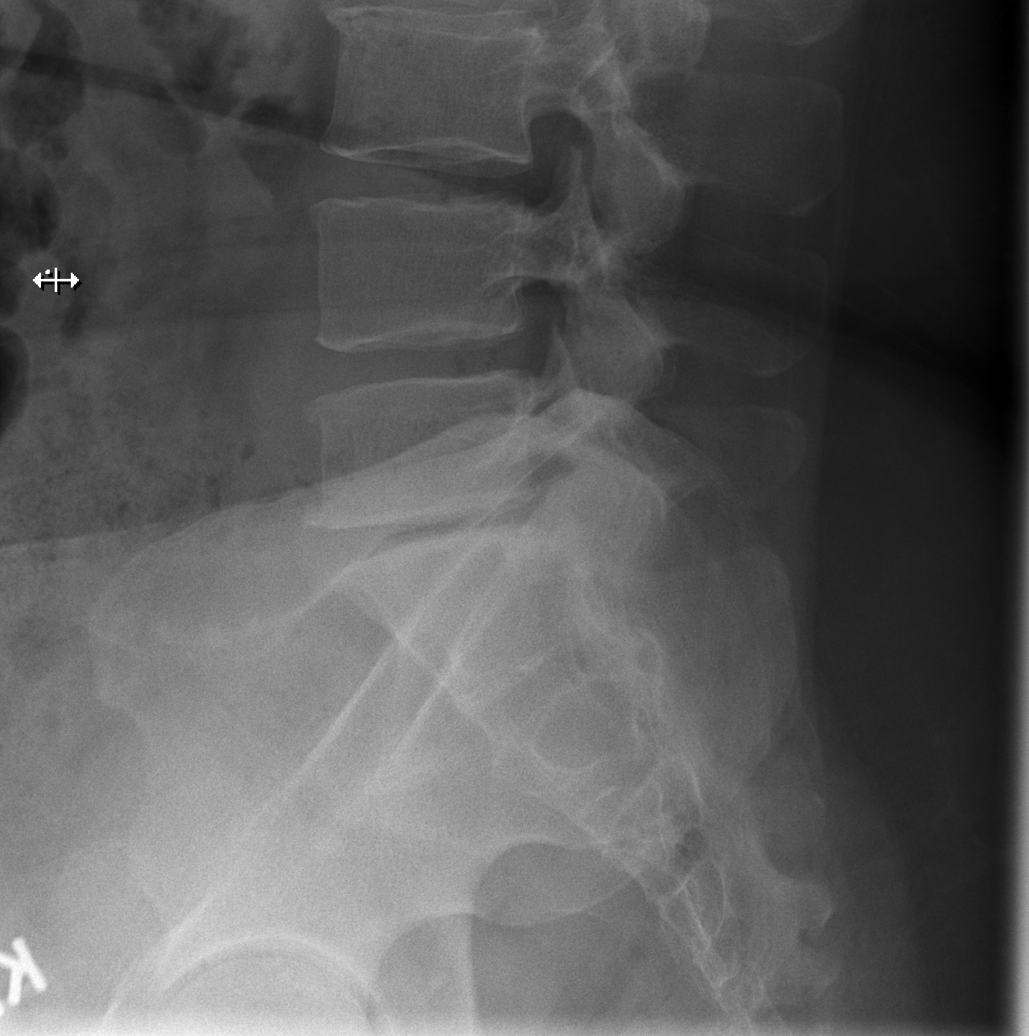

[5 of 5 positions shown; findings below may reference images not displayed]

FINDINGS: No recent fracture is seen. Alignment of posterior margins of
vertebral bodies is within normal limits. Degenerative changes are
noted with disc space narrowing at multiple levels, more so at
L2-L3, L4-L5 and L5-S1 levels. Anterior bony spurs seen at multiple
levels. Degenerative changes are noted in facet joints, more so at
L5-S1 level.
IMPRESSION: No recent fracture is seen. Lumbar spondylosis, more severe at L5-S1
level.
